# Patient Record
Sex: Female | Born: 1940 | Race: Black or African American | Hispanic: No | Smoking: Former smoker
Health system: Southern US, Community
[De-identification: ages and names within clinical notes are randomized; demographics above are authoritative.]

## PROBLEM LIST (undated history)

## (undated) DIAGNOSIS — I639 Cerebral infarction, unspecified: Secondary | ICD-10-CM

## (undated) DIAGNOSIS — E785 Hyperlipidemia, unspecified: Secondary | ICD-10-CM

## (undated) DIAGNOSIS — I10 Essential (primary) hypertension: Secondary | ICD-10-CM

## (undated) DIAGNOSIS — R413 Other amnesia: Secondary | ICD-10-CM

## (undated) HISTORY — DX: Other amnesia: R41.3

## (undated) HISTORY — DX: Essential (primary) hypertension: I10

## (undated) HISTORY — DX: Hyperlipidemia, unspecified: E78.5

## (undated) HISTORY — DX: Cerebral infarction, unspecified: I63.9

---

## 1977-12-23 HISTORY — PX: TOE SURGERY: SHX1073

## 1999-07-06 ENCOUNTER — Encounter: Admission: RE | Admit: 1999-07-06 | Discharge: 1999-07-06 | Payer: Self-pay | Admitting: *Deleted

## 2000-01-30 ENCOUNTER — Other Ambulatory Visit: Admission: RE | Admit: 2000-01-30 | Discharge: 2000-01-30 | Payer: Self-pay | Admitting: Internal Medicine

## 2000-03-22 ENCOUNTER — Encounter (INDEPENDENT_AMBULATORY_CARE_PROVIDER_SITE_OTHER): Payer: Self-pay | Admitting: *Deleted

## 2000-03-22 ENCOUNTER — Ambulatory Visit (HOSPITAL_COMMUNITY): Admission: RE | Admit: 2000-03-22 | Discharge: 2000-03-22 | Payer: Self-pay | Admitting: *Deleted

## 2000-07-08 ENCOUNTER — Encounter: Admission: RE | Admit: 2000-07-08 | Discharge: 2000-07-08 | Payer: Self-pay | Admitting: Occupational Medicine

## 2000-07-08 ENCOUNTER — Encounter: Payer: Self-pay | Admitting: Occupational Medicine

## 2001-07-10 ENCOUNTER — Encounter: Admission: RE | Admit: 2001-07-10 | Discharge: 2001-07-10 | Payer: Self-pay | Admitting: Occupational Medicine

## 2001-07-10 ENCOUNTER — Encounter: Payer: Self-pay | Admitting: Occupational Medicine

## 2002-01-28 ENCOUNTER — Encounter: Payer: Self-pay | Admitting: Emergency Medicine

## 2002-01-28 ENCOUNTER — Emergency Department (HOSPITAL_COMMUNITY): Admission: EM | Admit: 2002-01-28 | Discharge: 2002-01-28 | Payer: Self-pay | Admitting: Emergency Medicine

## 2002-07-23 ENCOUNTER — Encounter: Payer: Self-pay | Admitting: Internal Medicine

## 2002-07-23 ENCOUNTER — Encounter: Admission: RE | Admit: 2002-07-23 | Discharge: 2002-07-23 | Payer: Self-pay | Admitting: Internal Medicine

## 2003-10-30 ENCOUNTER — Encounter: Admission: RE | Admit: 2003-10-30 | Discharge: 2003-10-30 | Payer: Self-pay | Admitting: Occupational Medicine

## 2004-09-24 HISTORY — PX: OTHER SURGICAL HISTORY: SHX169

## 2005-01-12 ENCOUNTER — Encounter: Admission: RE | Admit: 2005-01-12 | Discharge: 2005-01-12 | Payer: Self-pay | Admitting: Internal Medicine

## 2005-06-08 ENCOUNTER — Ambulatory Visit (HOSPITAL_COMMUNITY): Admission: RE | Admit: 2005-06-08 | Discharge: 2005-06-08 | Payer: Self-pay | Admitting: Family Medicine

## 2005-06-15 ENCOUNTER — Ambulatory Visit: Payer: Self-pay | Admitting: Gastroenterology

## 2005-06-29 ENCOUNTER — Ambulatory Visit: Payer: Self-pay | Admitting: Gastroenterology

## 2006-02-28 ENCOUNTER — Encounter: Admission: RE | Admit: 2006-02-28 | Discharge: 2006-02-28 | Payer: Self-pay | Admitting: Family Medicine

## 2007-03-13 ENCOUNTER — Encounter: Admission: RE | Admit: 2007-03-13 | Discharge: 2007-03-13 | Payer: Self-pay | Admitting: Family Medicine

## 2009-04-27 ENCOUNTER — Emergency Department (HOSPITAL_COMMUNITY): Admission: EM | Admit: 2009-04-27 | Discharge: 2009-04-27 | Payer: Self-pay | Admitting: Emergency Medicine

## 2010-10-04 ENCOUNTER — Other Ambulatory Visit
Admission: RE | Admit: 2010-10-04 | Discharge: 2010-10-04 | Payer: Self-pay | Source: Home / Self Care | Admitting: Family Medicine

## 2010-10-06 ENCOUNTER — Encounter
Admission: RE | Admit: 2010-10-06 | Discharge: 2010-10-06 | Payer: Self-pay | Source: Home / Self Care | Attending: Family Medicine | Admitting: Family Medicine

## 2010-10-16 ENCOUNTER — Encounter
Admission: RE | Admit: 2010-10-16 | Discharge: 2010-10-16 | Payer: Self-pay | Source: Home / Self Care | Attending: Family Medicine | Admitting: Family Medicine

## 2010-10-24 ENCOUNTER — Encounter
Admission: RE | Admit: 2010-10-24 | Discharge: 2010-10-24 | Payer: Self-pay | Source: Home / Self Care | Attending: Family Medicine | Admitting: Family Medicine

## 2010-10-24 ENCOUNTER — Other Ambulatory Visit: Payer: Self-pay | Admitting: Radiology

## 2011-02-09 NOTE — Procedures (Signed)
[place]  Patient:    Yesenia Kramer, Yesenia Kramer                          MRN: [id number] Proc. Date: 03/22/00 Adm. Date:  [id number] Attending:  Sabino Gasser                           Procedure Report  PROCEDURE:  Colonoscopy.  ENDOSCOPIST: Sabino Gasser, M.D.  INDICATIONS: Colon polyp found on flexible sigmoidoscopy.  ANESTHESIA: Demerol 75 mg, Versed 7.5 mg IV in divided dose.  DESCRIPTION OF PROCEDURE: With the patient mildly sedated in the left lateral decubitus position, the Olympus videoscopic colonoscope was inserted in the rectum and passed under direct vision with pressure applied to the abdomen to the cecum. The cecum was identified by ileocecal valve and an appendiceal orifice both of which were photographed. From this point, the colonoscope was slowly withdrawn taking circumferential views of the entire colonic mucosa, stopping only then at 30 cm from the anal verge at which point the previously seen and photographed index polyp was once again noted. Using snare cautery technique on a setting of 20/20 blended current, it was removed. There was good hemostasis. The endoscope was then withdrawn to the rectum after retrieving the polyp in the rectum which appeared normal. The endoscope was placed in retroflexion and viewed the anal canal from above. This appeared normal and was photographed. The endoscope was straightened and withdrawn. The patients vital signs and pulse oximetry remained stable. The patient tolerated the procedure well without apparent complications.  OPERATIVE FINDINGS:  Polyp at 30 cm removed. The patient will call me for the results of the biopsies and followup with me as an outpatient. DD:  03/22/00 TD:  03/23/00 Job: [id number]

## 2011-05-26 DIAGNOSIS — I639 Cerebral infarction, unspecified: Secondary | ICD-10-CM

## 2011-05-26 HISTORY — DX: Cerebral infarction, unspecified: I63.9

## 2011-06-07 ENCOUNTER — Inpatient Hospital Stay (HOSPITAL_COMMUNITY)
Admission: EM | Admit: 2011-06-07 | Discharge: 2011-06-10 | DRG: 093 | Disposition: A | Discharge status: Home / Self Care | Payer: Medicare Other | Source: Ambulatory Visit | Attending: Neurology | Admitting: Neurology

## 2011-06-07 ENCOUNTER — Emergency Department (HOSPITAL_COMMUNITY): Payer: Medicare Other

## 2011-06-07 DIAGNOSIS — E669 Obesity, unspecified: Secondary | ICD-10-CM | POA: Diagnosis present

## 2011-06-07 DIAGNOSIS — Z7982 Long term (current) use of aspirin: Secondary | ICD-10-CM

## 2011-06-07 DIAGNOSIS — I10 Essential (primary) hypertension: Secondary | ICD-10-CM | POA: Diagnosis present

## 2011-06-07 DIAGNOSIS — I6509 Occlusion and stenosis of unspecified vertebral artery: Secondary | ICD-10-CM | POA: Diagnosis present

## 2011-06-07 DIAGNOSIS — R269 Unspecified abnormalities of gait and mobility: Principal | ICD-10-CM | POA: Diagnosis present

## 2011-06-07 DIAGNOSIS — R42 Dizziness and giddiness: Secondary | ICD-10-CM | POA: Diagnosis present

## 2011-06-07 LAB — TROPONIN I: Troponin I: <0.3 ng/mL (ref <0.30)

## 2011-06-07 LAB — CBC
HCT: 38.2 % (ref 36.0–46.0)
Hemoglobin: 12.4 g/dL (ref 12.0–15.0)
MCV: 82.3 fL (ref 78.0–100.0)
Platelets: 226 10*3/uL (ref 150–400)
WBC: 4.5 10*3/uL (ref 4.0–10.5)

## 2011-06-07 LAB — COMPREHENSIVE METABOLIC PANEL
ALT: 23 U/L (ref 0–35)
CO2: 30 mEq/L (ref 19–32)
Chloride: 102 mEq/L (ref 96–112)
Glucose, Bld: 121 mg/dL — ABNORMAL HIGH (ref 70–99)
Potassium: 4 mEq/L (ref 3.5–5.1)
Total Bilirubin: 0.2 mg/dL — ABNORMAL LOW (ref 0.3–1.2)

## 2011-06-07 LAB — DIFFERENTIAL
Basophils Absolute: 0 10*3/uL (ref 0.0–0.1)
Basophils Relative: 0 % (ref 0–1)
Eosinophils Relative: 2 % (ref 0–5)
Monocytes Absolute: 0.3 10*3/uL (ref 0.1–1.0)

## 2011-06-07 LAB — CK TOTAL AND CKMB (NOT AT ARMC): Total CK: 235 U/L — ABNORMAL HIGH (ref 7–177)

## 2011-06-07 LAB — URINALYSIS, ROUTINE W REFLEX MICROSCOPIC
Ketones, ur: NEGATIVE mg/dL
Leukocytes, UA: NEGATIVE
Nitrite: NEGATIVE
Protein, ur: NEGATIVE mg/dL
Specific Gravity, Urine: 1.025 (ref 1.005–1.030)
Urobilinogen, UA: 0.2 mg/dL (ref 0.0–1.0)

## 2011-06-07 LAB — POCT I-STAT TROPONIN I
Troponin i, poc: 0 ng/mL (ref 0.00–0.08)
Troponin i, poc: 0 ng/mL (ref 0.00–0.08)

## 2011-06-07 LAB — PROTIME-INR
INR: 0.89 (ref 0.00–1.49)
Prothrombin Time: 12.2 s (ref 11.6–15.2)

## 2011-06-07 MED ORDER — GADOBENATE DIMEGLUMINE 529 MG/ML IV SOLN: 15.0000 mL | Freq: Once | INTRAVENOUS | Status: AC | PRN

## 2011-06-08 DIAGNOSIS — I059 Rheumatic mitral valve disease, unspecified: Secondary | ICD-10-CM

## 2011-06-08 LAB — CK TOTAL AND CKMB (NOT AT ARMC)
CK, MB: 2.8 ng/mL (ref 0.3–4.0)
Relative Index: 1.4 (ref 0.0–2.5)
Total CK: 199 U/L — ABNORMAL HIGH (ref 7–177)

## 2011-06-08 LAB — HEMOGLOBIN A1C
Hgb A1c MFr Bld: 6.6 % — ABNORMAL HIGH (ref <5.7)
Mean Plasma Glucose: 143 mg/dL — ABNORMAL HIGH (ref <117)

## 2011-06-08 LAB — LIPID PANEL
LDL Cholesterol: 139 mg/dL — ABNORMAL HIGH (ref 0–99)
Triglycerides: 124 mg/dL (ref <150)
VLDL: 25 mg/dL (ref 0–40)

## 2011-06-08 LAB — GLUCOSE, CAPILLARY
Glucose-Capillary: 115 mg/dL — ABNORMAL HIGH (ref 70–99)
Glucose-Capillary: 87 mg/dL (ref 70–99)

## 2011-06-09 LAB — GLUCOSE, CAPILLARY: Glucose-Capillary: 101 mg/dL — ABNORMAL HIGH (ref 70–99)

## 2011-06-11 LAB — GLUCOSE, CAPILLARY
Glucose-Capillary: 109 mg/dL — ABNORMAL HIGH (ref 70–99)
Glucose-Capillary: 108 mg/dL — ABNORMAL HIGH (ref 70–99)
Glucose-Capillary: 107 mg/dL — ABNORMAL HIGH (ref 70–99)

## 2011-06-18 ENCOUNTER — Ambulatory Visit: Payer: Federal, State, Local not specified - PPO | Admitting: *Deleted

## 2011-06-25 ENCOUNTER — Ambulatory Visit: Payer: Federal, State, Local not specified - PPO | Admitting: Physical Therapy

## 2011-06-27 ENCOUNTER — Ambulatory Visit: Payer: Medicare Other | Attending: Neurology | Admitting: Physical Therapy

## 2011-06-27 DIAGNOSIS — IMO0001 Reserved for inherently not codable concepts without codable children: Secondary | ICD-10-CM | POA: Insufficient documentation

## 2011-06-27 DIAGNOSIS — R5381 Other malaise: Secondary | ICD-10-CM | POA: Insufficient documentation

## 2011-06-29 NOTE — H&P (Signed)
NAME:  Yesenia Kramer, Yesenia Kramer NO.:  [id number]  MEDICAL RECORD NO.:  [id number]  LOCATION:  [facility]                         FACILITY:  [facility]  PHYSICIAN:  Levert Feinstein, MD          DATE OF BIRTH:  1941/03/16  DATE OF ADMISSION:  06/07/2011 DATE OF DISCHARGE:                             HISTORY & PHYSICAL   CHIEF COMPLAINT:  Acute onset of dizziness, unbalanced gait.  HISTORY OF PRESENT ILLNESS:  The patient is a pleasant 70 year old left- handed African American female, went to bed at baseline on June 06, 2011, at 11 p.m., woke up the next morning 6 a.m., noticed unbalanced gait, difficulty focusing, nauseous, her symptom has been persistent since its onset.  She was brought by ambulance to emergency, noticed to have mild elevated blood pressure at 170/98.  She denies tinnitus, hearing change, MRI of the brain has demonstrated supratentorial and small vessel disease.  No acute lesions. MRA of the brain was normal.  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  LABORATORY EVALUATION:  Troponin was negative.  Normal CMP with the exception of mild elevated glucose at 121.  Normal CBC, UA.  Chest x-ray was negative.  REVIEW OF SYSTEMS:  Pertinent as above.  PAST MEDICAL HISTORY:  Hypertension for 10 years, taking Dyazide, not on aspirin treatment.  PAST SURGICAL HISTORY:  None.  ALLERGIES:  No known drug allergies.  FAMILY HISTORY:  Father died at age 68 with reason unknown.  Mother died of ovarian cancer at age 57.  SOCIAL HISTORY:  She lives alone, retired from [employer]  Has 2 adult children.  No smoking, no drinking, no illicit drugs.  PHYSICAL EXAMINATION:  VITAL SIGNS:  Temperature 98.3, blood pressure 172/98, heart rate of 56, respiration of 16, saturation was 97%. CARDIAC:  Regular rate and rhythm. NEUROLOGICAL:  She is awake, alert, oriented to history taking, care of conversation.  No  aphasia.  No dysarthria.  Cranial nerves II through XII.  Pupil equal, round, reactive to light.  She has horizontal nystagmus face to right, increased in nystagmus on right gaze, decreased to left gaze.  Facial sensation strength was normal.  Uvula and tongue midline.  Head turning, shoulder shrugging normal and symmetric.  Motor examination, moves all four extremities without difficulty.  No drift. Deep tendon reflex present, symmetric.  Plantar responses were flexor. Coordination normal finger-to-nose, heel-to-shin, but she has trunk ataxia tends severe to the right side ataxic gait.  ASSESSMENT/PLAN:  A 70 year old African American female with vascular risk factor of obesity, hypertension, not on antiplatelet agent presenting with acute onset of ataxic gait, dizziness, there was nystagmus and trunk ataxia on physical examination, most consistent with small vessel acute infarction involving white brain stem or cerebellum even though it does not show up on the initial MRI scan. 1. Complete evaluation with echocardiogram. 2. Laboratory evaluation. 3. Start aspirin 325 mg every day. 4. PT/OT.     Levert Feinstein, MD     YY/MEDQ  D:  06/07/2011  T:  06/08/2011  Job:  [id number]  Electronically Signed by Levert Feinstein MD  on 06/29/2011 10:08:36 AM

## 2011-07-13 NOTE — Discharge Summary (Signed)
  NAME:  Yesenia Kramer, Yesenia Kramer NO.:  [id number]  MEDICAL RECORD NO.:  [id number]  LOCATION:  3008                         FACILITY:  [facility]  PHYSICIAN:  Joycelyn Schmid, MD   DATE OF BIRTH:  [birth year]  DATE OF ADMISSION:  06/07/2011 DATE OF DISCHARGE:  06/10/2011                              DISCHARGE SUMMARY   DISCHARGE DIAGNOSIS:  MRI negative stroke.  PRINCIPAL PROCEDURE: 1. MRI of the brain, which shows mild atrophy and chronic small-vessel     ischemic disease with no acute findings. 2. MRA of the head shows no intracranial stenosis or aneurysm. 3. 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  PERTINENT LABORATORY DATA:  Lipid panel; LDL 139, cholesterol 214, triglycerides 124, and HDL 50.  Hemoglobin A1c 6.6.  Transthoracic echocardiogram shows ejection fraction 60% to 65%, mild mitral regurgitation.  No cardiac source of emboli.  HOSPITAL COURSE:  The patient presented with gait difficulty, nausea, and truncal ataxia on June 07, 2011, at 6 o'clock a.m. when she woke up.  Last seen normal was 11 o'clock the prior night.  The patient came in hypertensive, 170/98.  The patient progressively improved in her symptoms.  She had a stroke workup.  On day of discharge, the patient was able to have full strength in her upper and lower extremities.  No vertigo or dizziness.  She was able to walk 200 feet with rolling walker with physical therapy.  No occupational therapy needs were recommended.  DISCHARGE INSTRUCTIONS: 1. Follow up with primary care in 4-6 weeks, Dr. Jillyn Hidden. 2. Follow up with Dr. Pearlean Brownie, Stroke [facility] in 4 weeks, phone number     [phone]. 3. No driving until follow up with Stroke Clinic. 4. The patient is unable to participate in jury duty because of     driving restrictions, limited mobility, and recent stroke. 5. Outpatient physical therapy.     Joycelyn Schmid, MD     VP/MEDQ  D:  06/10/2011  T:   06/10/2011  Job:  [id number]  Electronically Signed by Joycelyn Schmid  on 07/13/2011 05:44:48 PM

## 2011-07-27 IMAGING — US US CORE
1 series · 10 of 10 positions shown · non-contrast
Comparison: none

***ADDENDUM*** CREATED: 10/27/2010 [DATE]

Pathology revealed benign breast tissue with cysts and focal
stromal fibrosis in the right breast. This was found to be
concordant by Dr. Haji Aimal Nargis. Pathology was relayed by
telephone. The patient reported doing well after the biopsy. Post
biopsy instructions were reviewed and her questions were answered.
She was encouraged to call The [REDACTED]
for any additional concerns. She was asked to return in 1 year for
screening mammography.
Pathology results are dictated by Gayeng Artika RN, BSN on [place]
Addended by:  Ansovar Rramsay, M.D. on 10/27/2010 [DATE].
***END ADDENDUM*** SIGNED BY: Ansovar Rramsay, M.D.
CLINICAL DATA: Right breast mass

[Series 1: us core · 10 of 10 slices shown]
[im 1/10]
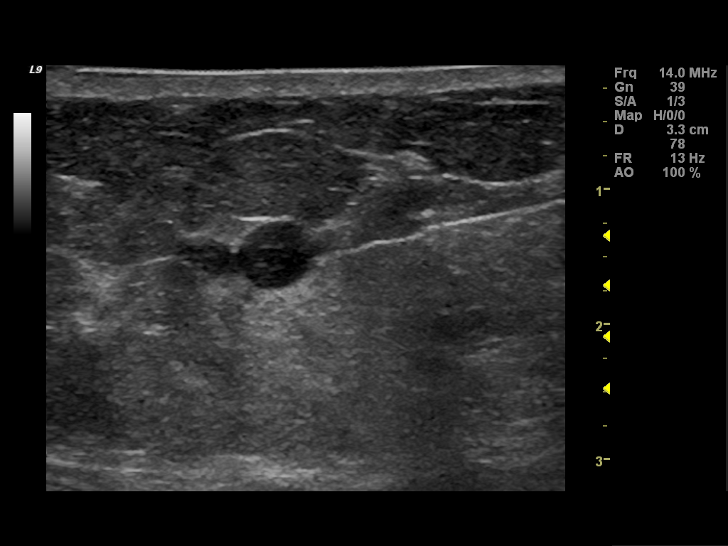
[im 2/10]
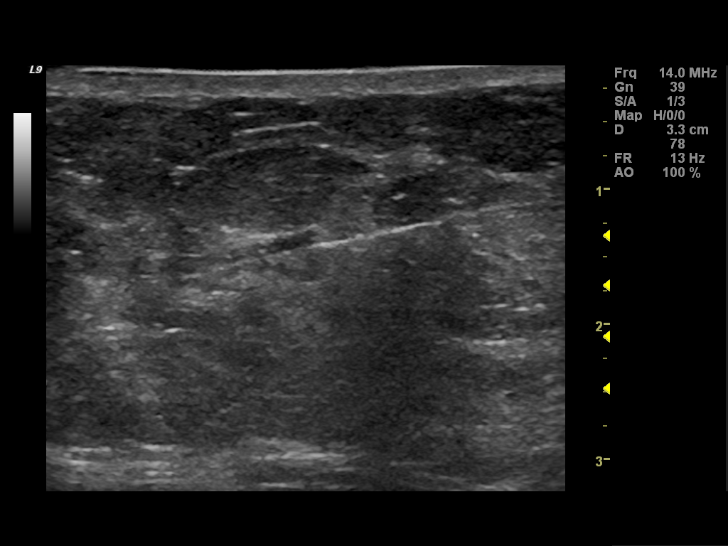
[im 3/10]
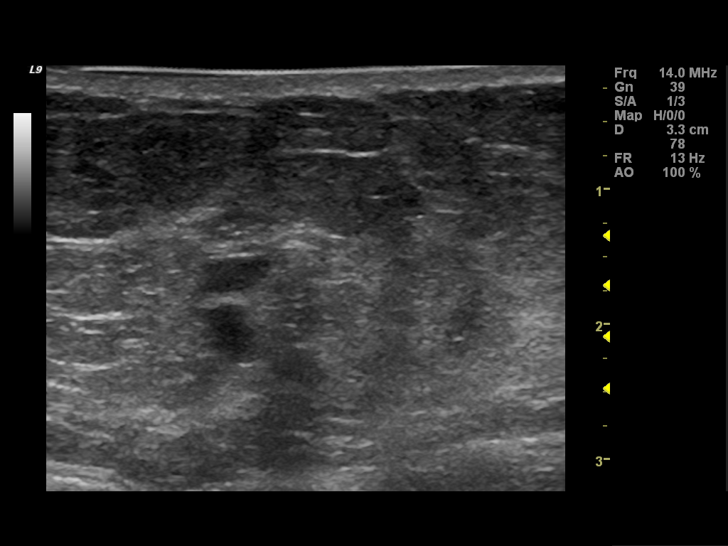
[im 4/10]
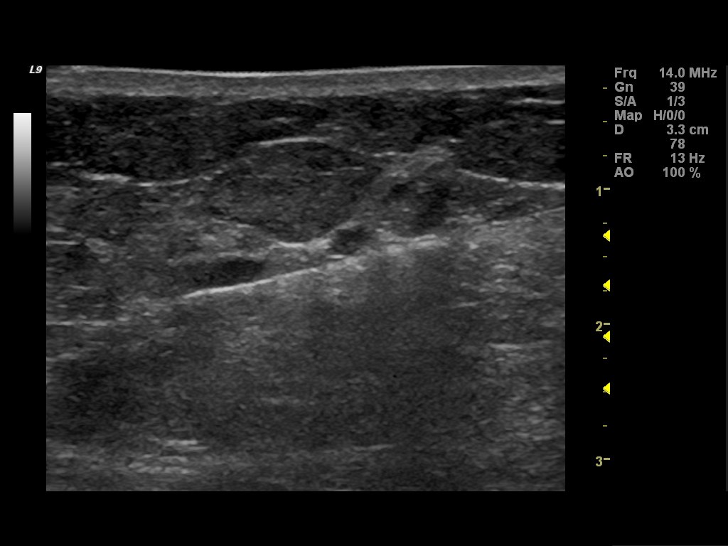
[im 5/10]
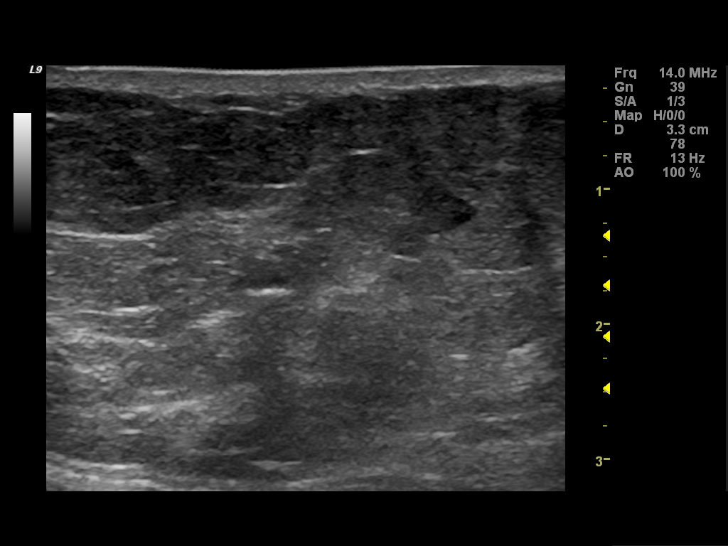
[im 6/10]
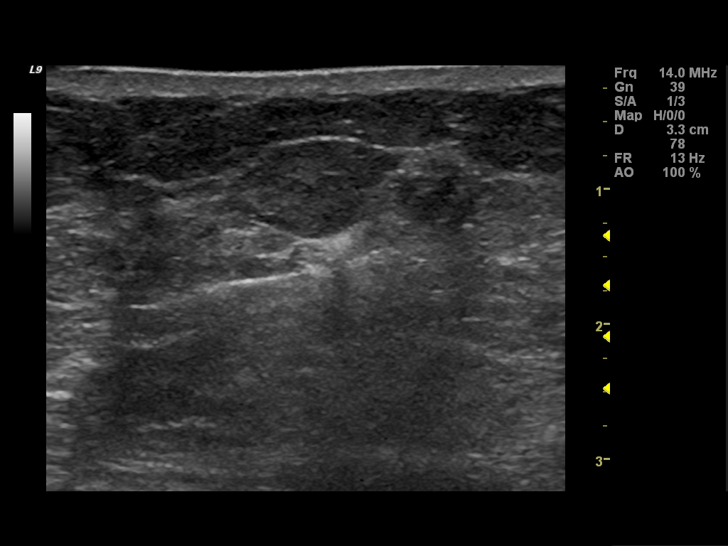
[im 7/10]
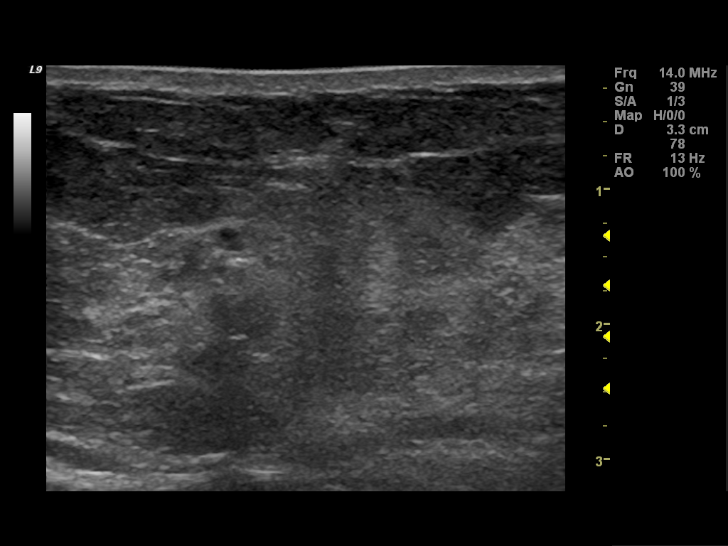
[im 8/10]
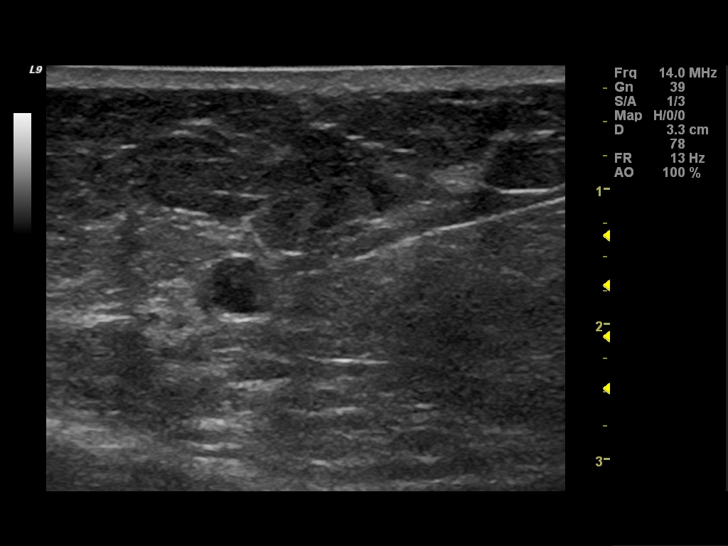
[im 9/10]
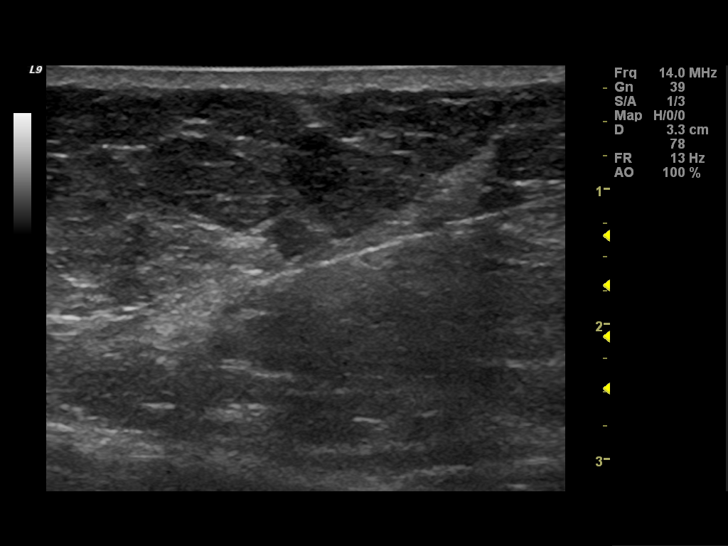
[im 10/10]
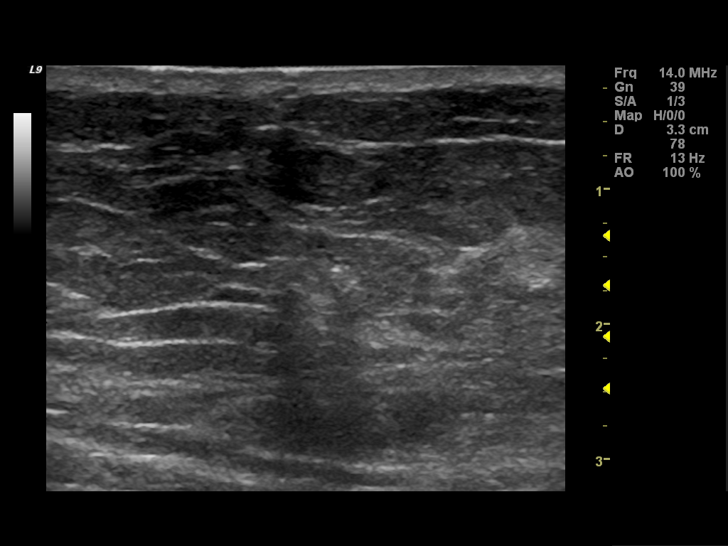

[10 of 10 positions shown; findings below may reference images not displayed]

ULTRASOUND GUIDED CORE BIOPSY OF THE RIGHT BREAST

I met with the patient, and we discussed the procedure of
ultrasound-guided biopsy, including risks, benefits, and
alternatives.  Specifically, we discussed the risks of infection,
bleeding, tissue injury, clip migration, and inadequate sampling.
Informed, written consent was given.

Using sterile technique,  2% lidocaine, ultrasound guidance, and a
14 gauge automated biopsy device, biopsy was performed of the mass
located within the right breast at the 10 o'clock position.  At the
conclusion of the procedure, a [employer]dromark  tissue marker clip was
deployed into the biopsy cavity.  Follow-up 2-view mammogram was
performed and dictated separately.
IMPRESSION: Ultrasound-guided biopsy of the right breast mass located at the 10
o'clock position as discussed above.  No apparent complications.

## 2011-09-10 ENCOUNTER — Other Ambulatory Visit: Payer: Self-pay | Admitting: Family Medicine

## 2011-09-10 DIAGNOSIS — Z1231 Encounter for screening mammogram for malignant neoplasm of breast: Secondary | ICD-10-CM

## 2011-10-10 ENCOUNTER — Other Ambulatory Visit: Payer: Self-pay | Admitting: Family Medicine

## 2011-10-10 ENCOUNTER — Ambulatory Visit
Admission: RE | Admit: 2011-10-10 | Discharge: 2011-10-10 | Disposition: A | Discharge status: Home / Self Care | Payer: Federal, State, Local not specified - PPO | Source: Ambulatory Visit | Attending: Family Medicine | Admitting: Family Medicine

## 2011-10-10 DIAGNOSIS — N63 Unspecified lump in unspecified breast: Secondary | ICD-10-CM

## 2011-10-10 DIAGNOSIS — Z1231 Encounter for screening mammogram for malignant neoplasm of breast: Secondary | ICD-10-CM

## 2011-10-12 ENCOUNTER — Ambulatory Visit: Payer: Medicare Other

## 2011-10-19 DIAGNOSIS — I6350 Cerebral infarction due to unspecified occlusion or stenosis of unspecified cerebral artery: Secondary | ICD-10-CM | POA: Diagnosis not present

## 2011-10-19 DIAGNOSIS — Z79899 Other long term (current) drug therapy: Secondary | ICD-10-CM | POA: Diagnosis not present

## 2011-10-19 DIAGNOSIS — L301 Dyshidrosis [pompholyx]: Secondary | ICD-10-CM | POA: Diagnosis not present

## 2011-10-24 ENCOUNTER — Other Ambulatory Visit: Payer: Self-pay | Admitting: Family Medicine

## 2011-10-24 ENCOUNTER — Ambulatory Visit
Admission: RE | Admit: 2011-10-24 | Discharge: 2011-10-24 | Disposition: A | Discharge status: Home / Self Care | Payer: Medicare Other | Source: Ambulatory Visit | Attending: Family Medicine | Admitting: Family Medicine

## 2011-10-24 ENCOUNTER — Ambulatory Visit
Admission: RE | Admit: 2011-10-24 | Discharge: 2011-10-24 | Disposition: A | Discharge status: Home / Self Care | Payer: Federal, State, Local not specified - PPO | Source: Ambulatory Visit | Attending: Family Medicine | Admitting: Family Medicine

## 2011-10-24 DIAGNOSIS — N6489 Other specified disorders of breast: Secondary | ICD-10-CM | POA: Diagnosis not present

## 2011-10-24 DIAGNOSIS — N63 Unspecified lump in unspecified breast: Secondary | ICD-10-CM

## 2012-02-13 DIAGNOSIS — L301 Dyshidrosis [pompholyx]: Secondary | ICD-10-CM | POA: Diagnosis not present

## 2012-03-12 DIAGNOSIS — L301 Dyshidrosis [pompholyx]: Secondary | ICD-10-CM | POA: Diagnosis not present

## 2012-07-08 DIAGNOSIS — Z79899 Other long term (current) drug therapy: Secondary | ICD-10-CM | POA: Diagnosis not present

## 2012-07-08 DIAGNOSIS — Z23 Encounter for immunization: Secondary | ICD-10-CM | POA: Diagnosis not present

## 2012-07-08 DIAGNOSIS — I6350 Cerebral infarction due to unspecified occlusion or stenosis of unspecified cerebral artery: Secondary | ICD-10-CM | POA: Diagnosis not present

## 2012-07-11 ENCOUNTER — Encounter: Payer: Self-pay | Admitting: Gastroenterology

## 2012-08-29 ENCOUNTER — Ambulatory Visit (AMBULATORY_SURGERY_CENTER): Payer: Federal, State, Local not specified - PPO | Admitting: *Deleted

## 2012-08-29 VITALS — Ht 66.0 in | Wt 230.0 lb

## 2012-08-29 DIAGNOSIS — Z1211 Encounter for screening for malignant neoplasm of colon: Secondary | ICD-10-CM

## 2012-08-29 MED ORDER — SUPREP BOWEL PREP KIT 17.5-3.13-1.6 GM/177ML PO SOLN: ORAL | Status: DC

## 2012-09-11 ENCOUNTER — Encounter: Payer: Self-pay | Admitting: Gastroenterology

## 2012-09-11 ENCOUNTER — Ambulatory Visit (AMBULATORY_SURGERY_CENTER): Payer: Federal, State, Local not specified - PPO | Admitting: Gastroenterology

## 2012-09-11 VITALS — BP 128/74 | HR 59 | Temp 97.8°F | Resp 10 | Ht 66.0 in | Wt 230.0 lb

## 2012-09-11 DIAGNOSIS — Z1211 Encounter for screening for malignant neoplasm of colon: Secondary | ICD-10-CM | POA: Diagnosis not present

## 2012-09-11 DIAGNOSIS — Z8673 Personal history of transient ischemic attack (TIA), and cerebral infarction without residual deficits: Secondary | ICD-10-CM | POA: Diagnosis not present

## 2012-09-11 DIAGNOSIS — K5730 Diverticulosis of large intestine without perforation or abscess without bleeding: Secondary | ICD-10-CM

## 2012-09-11 DIAGNOSIS — E669 Obesity, unspecified: Secondary | ICD-10-CM | POA: Diagnosis not present

## 2012-09-11 MED ORDER — SODIUM CHLORIDE 0.9 % IV SOLN: 500.0000 mL | INTRAVENOUS | Status: DC

## 2012-09-11 NOTE — Progress Notes (Signed)
Patient did not have preoperative order for IV antibiotic SSI prophylaxis. (G8918)  Patient did not experience any of the following events: a burn prior to discharge; a fall within the facility; wrong site/side/patient/procedure/implant event; or a hospital transfer or hospital admission upon discharge from the facility. (G8907)  

## 2012-09-11 NOTE — Progress Notes (Signed)
Propofol given over incremental dosages 

## 2012-09-11 NOTE — Op Note (Signed)
[facility] [address] [place], [address]   COLONOSCOPY PROCEDURE REPORT  PATIENT: Yesenia Kramer, Yesenia Kramer  MR#: [id number] BIRTHDATE: 04/30/1941 , 71  yrs. old GENDER: Female ENDOSCOPIST: Louis Meckel, MD REFERRED UV:OZDGUY Fulp PROCEDURE DATE:  09/11/2012 PROCEDURE:   Colonoscopy, diagnostic ASA CLASS:   Class II INDICATIONS:average risk screening. MEDICATIONS: MAC sedation, administered by CRNA and propofol (Diprivan) 150mg  IV  DESCRIPTION OF PROCEDURE:   After the risks benefits and alternatives of the procedure were thoroughly explained, informed consent was obtained.  A digital rectal exam revealed no abnormalities of the rectum.   The LB [id number]-H180AL [id number]  endoscope was introduced through the anus and advanced to the cecum, which was identified by both the appendix and ileocecal valve. No adverse events experienced.   The quality of the prep was Suprep excellent The instrument was then slowly withdrawn as the colon was fully examined.      COLON FINDINGS: Mild diverticulosis was noted in the sigmoid colon. The colon mucosa was otherwise normal.  Retroflexed views revealed no abnormalities. The time to cecum=5 minutes 26 seconds. Withdrawal time=7 minutes 47 seconds.  The scope was withdrawn and the procedure completed. COMPLICATIONS: There were no complications.  ENDOSCOPIC IMPRESSION: 1.   Mild diverticulosis was noted in the sigmoid colon 2.   The colon mucosa was otherwise normal  RECOMMENDATIONS: Continue current colorectal screening recommendations for "routine risk" patients with a repeat colonoscopy in 10 years.   eSigned:  Louis Meckel, MD 09/11/2012 10:52 AM   cc:

## 2012-09-11 NOTE — Patient Instructions (Addendum)

## 2012-09-12 ENCOUNTER — Telehealth: Payer: Self-pay | Admitting: *Deleted

## 2012-09-12 NOTE — Telephone Encounter (Signed)
  Follow up Call-  Call back number 09/11/2012  Post procedure Call Back phone  # [phone]  Permission to leave phone message Yes     Patient questions:  Do you have a fever, pain , or abdominal swelling? no Pain Score  0 *  Have you tolerated food without any problems? yes  Have you been able to return to your normal activities? yes  Do you have any questions about your discharge instructions: Diet   no Medications  no Follow up visit  no  Do you have questions or concerns about your Care? no  Actions: * If pain score is 4 or above: No action needed, pain <4.

## 2012-12-02 DIAGNOSIS — H52229 Regular astigmatism, unspecified eye: Secondary | ICD-10-CM | POA: Diagnosis not present

## 2012-12-02 DIAGNOSIS — H2510 Age-related nuclear cataract, unspecified eye: Secondary | ICD-10-CM | POA: Diagnosis not present

## 2012-12-02 DIAGNOSIS — H538 Other visual disturbances: Secondary | ICD-10-CM | POA: Diagnosis not present

## 2012-12-02 DIAGNOSIS — H43819 Vitreous degeneration, unspecified eye: Secondary | ICD-10-CM | POA: Diagnosis not present

## 2012-12-02 DIAGNOSIS — H524 Presbyopia: Secondary | ICD-10-CM | POA: Diagnosis not present

## 2012-12-15 ENCOUNTER — Other Ambulatory Visit: Payer: Self-pay | Admitting: Family Medicine

## 2012-12-15 DIAGNOSIS — N63 Unspecified lump in unspecified breast: Secondary | ICD-10-CM

## 2012-12-22 ENCOUNTER — Ambulatory Visit
Admission: RE | Admit: 2012-12-22 | Discharge: 2012-12-22 | Disposition: A | Discharge status: Home / Self Care | Payer: Medicare Other | Source: Ambulatory Visit | Attending: Family Medicine | Admitting: Family Medicine

## 2012-12-22 DIAGNOSIS — N63 Unspecified lump in unspecified breast: Secondary | ICD-10-CM

## 2012-12-22 DIAGNOSIS — R928 Other abnormal and inconclusive findings on diagnostic imaging of breast: Secondary | ICD-10-CM | POA: Diagnosis not present

## 2013-10-06 ENCOUNTER — Ambulatory Visit (INDEPENDENT_AMBULATORY_CARE_PROVIDER_SITE_OTHER): Payer: Self-pay | Admitting: *Deleted

## 2013-10-06 DIAGNOSIS — I6350 Cerebral infarction due to unspecified occlusion or stenosis of unspecified cerebral artery: Secondary | ICD-10-CM

## 2013-10-06 DIAGNOSIS — I639 Cerebral infarction, unspecified: Secondary | ICD-10-CM

## 2013-10-06 NOTE — Progress Notes (Signed)
Participant in the office today for her IRIS 2nd. Annual visit. MMSE was completed successfully.  Blood was drawn and Shipped.  Participant to return old bottle of study medication to the coordinating center.  Participant to continue with follow- up visits per IRIS protocol.

## 2013-11-11 DIAGNOSIS — E785 Hyperlipidemia, unspecified: Secondary | ICD-10-CM | POA: Diagnosis not present

## 2013-11-11 DIAGNOSIS — I10 Essential (primary) hypertension: Secondary | ICD-10-CM | POA: Diagnosis not present

## 2013-11-11 DIAGNOSIS — Z8673 Personal history of transient ischemic attack (TIA), and cerebral infarction without residual deficits: Secondary | ICD-10-CM | POA: Diagnosis not present

## 2013-11-11 DIAGNOSIS — B029 Zoster without complications: Secondary | ICD-10-CM | POA: Diagnosis not present

## 2013-12-08 DIAGNOSIS — H52229 Regular astigmatism, unspecified eye: Secondary | ICD-10-CM | POA: Diagnosis not present

## 2013-12-08 DIAGNOSIS — H2510 Age-related nuclear cataract, unspecified eye: Secondary | ICD-10-CM | POA: Diagnosis not present

## 2013-12-08 DIAGNOSIS — H538 Other visual disturbances: Secondary | ICD-10-CM | POA: Diagnosis not present

## 2013-12-08 DIAGNOSIS — H524 Presbyopia: Secondary | ICD-10-CM | POA: Diagnosis not present

## 2014-03-31 DIAGNOSIS — Z79899 Other long term (current) drug therapy: Secondary | ICD-10-CM | POA: Diagnosis not present

## 2014-03-31 DIAGNOSIS — I10 Essential (primary) hypertension: Secondary | ICD-10-CM | POA: Diagnosis not present

## 2014-05-04 DIAGNOSIS — Z23 Encounter for immunization: Secondary | ICD-10-CM | POA: Diagnosis not present

## 2014-05-04 DIAGNOSIS — E785 Hyperlipidemia, unspecified: Secondary | ICD-10-CM | POA: Diagnosis not present

## 2014-05-04 DIAGNOSIS — M79609 Pain in unspecified limb: Secondary | ICD-10-CM | POA: Diagnosis not present

## 2014-05-04 DIAGNOSIS — Z79899 Other long term (current) drug therapy: Secondary | ICD-10-CM | POA: Diagnosis not present

## 2014-05-04 DIAGNOSIS — Z1211 Encounter for screening for malignant neoplasm of colon: Secondary | ICD-10-CM | POA: Diagnosis not present

## 2014-05-04 DIAGNOSIS — R7301 Impaired fasting glucose: Secondary | ICD-10-CM | POA: Diagnosis not present

## 2014-05-04 DIAGNOSIS — I679 Cerebrovascular disease, unspecified: Secondary | ICD-10-CM | POA: Diagnosis not present

## 2014-05-04 DIAGNOSIS — I10 Essential (primary) hypertension: Secondary | ICD-10-CM | POA: Diagnosis not present

## 2014-05-05 ENCOUNTER — Other Ambulatory Visit: Payer: Self-pay | Admitting: Family Medicine

## 2014-05-05 ENCOUNTER — Other Ambulatory Visit: Payer: Self-pay

## 2014-05-05 DIAGNOSIS — Z1231 Encounter for screening mammogram for malignant neoplasm of breast: Secondary | ICD-10-CM

## 2014-05-05 DIAGNOSIS — Z8673 Personal history of transient ischemic attack (TIA), and cerebral infarction without residual deficits: Secondary | ICD-10-CM

## 2014-05-05 DIAGNOSIS — E2839 Other primary ovarian failure: Secondary | ICD-10-CM

## 2014-05-14 ENCOUNTER — Ambulatory Visit
Admission: RE | Admit: 2014-05-14 | Discharge: 2014-05-14 | Disposition: A | Discharge status: Home / Self Care | Payer: Medicare Other | Source: Ambulatory Visit | Attending: Family Medicine | Admitting: Family Medicine

## 2014-05-14 ENCOUNTER — Encounter (INDEPENDENT_AMBULATORY_CARE_PROVIDER_SITE_OTHER): Payer: Self-pay

## 2014-05-14 DIAGNOSIS — I658 Occlusion and stenosis of other precerebral arteries: Secondary | ICD-10-CM | POA: Diagnosis not present

## 2014-05-14 DIAGNOSIS — Z8673 Personal history of transient ischemic attack (TIA), and cerebral infarction without residual deficits: Secondary | ICD-10-CM

## 2014-05-18 DIAGNOSIS — E785 Hyperlipidemia, unspecified: Secondary | ICD-10-CM | POA: Diagnosis not present

## 2014-05-18 DIAGNOSIS — I679 Cerebrovascular disease, unspecified: Secondary | ICD-10-CM | POA: Diagnosis not present

## 2014-05-18 DIAGNOSIS — R7301 Impaired fasting glucose: Secondary | ICD-10-CM | POA: Diagnosis not present

## 2014-05-18 DIAGNOSIS — Z7901 Long term (current) use of anticoagulants: Secondary | ICD-10-CM | POA: Diagnosis not present

## 2014-05-19 ENCOUNTER — Ambulatory Visit
Admission: RE | Admit: 2014-05-19 | Discharge: 2014-05-19 | Disposition: A | Discharge status: Home / Self Care | Payer: Medicare Other | Source: Ambulatory Visit

## 2014-05-19 ENCOUNTER — Ambulatory Visit
Admission: RE | Admit: 2014-05-19 | Discharge: 2014-05-19 | Disposition: A | Discharge status: Home / Self Care | Payer: Medicare Other | Source: Ambulatory Visit | Attending: Family Medicine | Admitting: Family Medicine

## 2014-05-19 DIAGNOSIS — E2839 Other primary ovarian failure: Secondary | ICD-10-CM

## 2014-05-19 DIAGNOSIS — Z1231 Encounter for screening mammogram for malignant neoplasm of breast: Secondary | ICD-10-CM | POA: Diagnosis not present

## 2014-05-19 DIAGNOSIS — Z780 Asymptomatic menopausal state: Secondary | ICD-10-CM | POA: Diagnosis not present

## 2014-05-28 ENCOUNTER — Other Ambulatory Visit: Payer: Self-pay | Admitting: Family Medicine

## 2014-05-28 DIAGNOSIS — E049 Nontoxic goiter, unspecified: Secondary | ICD-10-CM

## 2014-06-02 ENCOUNTER — Ambulatory Visit
Admission: RE | Admit: 2014-06-02 | Discharge: 2014-06-02 | Disposition: A | Discharge status: Home / Self Care | Payer: Medicare Other | Source: Ambulatory Visit | Attending: Family Medicine | Admitting: Family Medicine

## 2014-06-02 DIAGNOSIS — E042 Nontoxic multinodular goiter: Secondary | ICD-10-CM | POA: Diagnosis not present

## 2014-06-02 DIAGNOSIS — E049 Nontoxic goiter, unspecified: Secondary | ICD-10-CM

## 2014-11-30 DIAGNOSIS — I10 Essential (primary) hypertension: Secondary | ICD-10-CM | POA: Diagnosis not present

## 2014-11-30 DIAGNOSIS — R7309 Other abnormal glucose: Secondary | ICD-10-CM | POA: Diagnosis not present

## 2014-11-30 DIAGNOSIS — I6529 Occlusion and stenosis of unspecified carotid artery: Secondary | ICD-10-CM | POA: Diagnosis not present

## 2014-11-30 DIAGNOSIS — M79606 Pain in leg, unspecified: Secondary | ICD-10-CM | POA: Diagnosis not present

## 2014-11-30 DIAGNOSIS — I6931 Cognitive deficits following cerebral infarction: Secondary | ICD-10-CM | POA: Diagnosis not present

## 2014-11-30 DIAGNOSIS — E785 Hyperlipidemia, unspecified: Secondary | ICD-10-CM | POA: Diagnosis not present

## 2014-11-30 DIAGNOSIS — E041 Nontoxic single thyroid nodule: Secondary | ICD-10-CM | POA: Diagnosis not present

## 2014-12-08 ENCOUNTER — Other Ambulatory Visit: Payer: Self-pay | Admitting: *Deleted

## 2014-12-08 DIAGNOSIS — M7989 Other specified soft tissue disorders: Secondary | ICD-10-CM

## 2014-12-08 DIAGNOSIS — M79606 Pain in leg, unspecified: Secondary | ICD-10-CM

## 2014-12-14 DIAGNOSIS — H538 Other visual disturbances: Secondary | ICD-10-CM | POA: Diagnosis not present

## 2014-12-14 DIAGNOSIS — H43812 Vitreous degeneration, left eye: Secondary | ICD-10-CM | POA: Diagnosis not present

## 2014-12-14 DIAGNOSIS — H52229 Regular astigmatism, unspecified eye: Secondary | ICD-10-CM | POA: Diagnosis not present

## 2014-12-14 DIAGNOSIS — H524 Presbyopia: Secondary | ICD-10-CM | POA: Diagnosis not present

## 2014-12-14 DIAGNOSIS — H2513 Age-related nuclear cataract, bilateral: Secondary | ICD-10-CM | POA: Diagnosis not present

## 2015-01-06 ENCOUNTER — Encounter (HOSPITAL_COMMUNITY): Payer: Federal, State, Local not specified - PPO

## 2015-01-06 ENCOUNTER — Encounter: Payer: Federal, State, Local not specified - PPO | Admitting: Vascular Surgery

## 2015-01-19 ENCOUNTER — Encounter: Payer: Self-pay | Admitting: Vascular Surgery

## 2015-01-20 ENCOUNTER — Encounter: Payer: Self-pay | Admitting: Vascular Surgery

## 2015-01-20 ENCOUNTER — Ambulatory Visit (INDEPENDENT_AMBULATORY_CARE_PROVIDER_SITE_OTHER): Payer: Medicare Other | Admitting: Vascular Surgery

## 2015-01-20 ENCOUNTER — Ambulatory Visit (HOSPITAL_COMMUNITY)
Admission: RE | Admit: 2015-01-20 | Discharge: 2015-01-20 | Disposition: A | Discharge status: Home / Self Care | Payer: Medicare Other | Source: Ambulatory Visit | Attending: Vascular Surgery | Admitting: Vascular Surgery

## 2015-01-20 VITALS — BP 155/76 | HR 70 | Ht 66.0 in | Wt 232.0 lb

## 2015-01-20 DIAGNOSIS — M7989 Other specified soft tissue disorders: Secondary | ICD-10-CM | POA: Diagnosis not present

## 2015-01-20 DIAGNOSIS — M79662 Pain in left lower leg: Secondary | ICD-10-CM | POA: Insufficient documentation

## 2015-01-20 DIAGNOSIS — M79605 Pain in left leg: Secondary | ICD-10-CM | POA: Diagnosis not present

## 2015-01-20 DIAGNOSIS — I83812 Varicose veins of left lower extremities with pain: Secondary | ICD-10-CM

## 2015-01-20 DIAGNOSIS — M79606 Pain in leg, unspecified: Secondary | ICD-10-CM

## 2015-01-20 NOTE — Progress Notes (Signed)
[facility] [place] HISTORY AND PHYSICAL   History of Present Illness:  Patient is a 74 y.o. year old female who presents for evaluation of left leg pain. The patient has had a several week history of pain on the medial aspect of her left leg just below the knee. She denies any trauma to this area. She states it hurts sometimes at night time when she is trying to sleep. She states it does get better after she has been moving for a while. She states it hurts the most when she is trying to get in and out of a chair. She denies prior history of DVT. She is family history of varicose veins. She does not really describe joint pain. She states she has some occasional swelling in the left leg but this is subtle. She has never worn compression stockings in the past.  Other medical problems include hyperlipidemia and hypertension. These are currently controlled.  Past Medical History  Diagnosis Date  . Hyperlipidemia   . Hypertension   . Stroke 05/2011    no paralysis    Past Surgical History  Procedure Laterality Date  . Toe surgery  aprox. 1979    bilateral small toes  . Gums  2006    Social History History  Substance Use Topics  . Smoking status: Former Smoker    Quit date: 09/24/1992  . Smokeless tobacco: Never Used  . Alcohol Use: No    Family History Family History  Problem Relation Age of Onset  . Colon cancer Paternal Aunt   . Esophageal cancer Neg Hx   . Rectal cancer Neg Hx   . Stomach cancer Neg Hx   . Cancer Mother   . Heart disease Brother   . Hypertension Daughter     Allergies  No Known Allergies   Current Outpatient Prescriptions  Medication Sig Dispense Refill  . aspirin 325 MG tablet Take 325 mg by mouth daily.    . hydrochlorothiazide (HYDRODIURIL) 25 MG tablet Take 25 mg by mouth daily.    Marland Kitchen losartan (COZAAR) 50 MG tablet Take 50 mg by mouth daily.   0  . simvastatin (ZOCOR) 20 MG tablet Take 20 mg by mouth every evening.    .  pioglitazone (ACTOS) 45 MG tablet Take 45 mg by mouth daily. May be a placebo.  Pt. Is in a blind study.  She had a stroke 05/2011     No current facility-administered medications for this visit.    ROS:   General:  No weight loss, Fever, chills  HEENT: No recent headaches, no nasal bleeding, no visual changes, no sore throat  Neurologic: No dizziness, blackouts, seizures. No recent symptoms of stroke or mini- stroke. No recent episodes of slurred speech, or temporary blindness.  Cardiac: No recent episodes of chest pain/pressure, no shortness of breath at rest.  No shortness of breath with exertion.  Denies history of atrial fibrillation or irregular heartbeat  Vascular: No history of rest pain in feet.  No history of claudication.  No history of non-healing ulcer, No history of DVT   Pulmonary: No home oxygen, no productive cough, no hemoptysis,  No asthma or wheezing  Musculoskeletal:  [ ]  Arthritis, [ ]  Low back pain,  [ ]  Joint pain  Hematologic:No history of hypercoagulable state.  No history of easy bleeding.  No history of anemia  Gastrointestinal: No hematochezia or melena,  No gastroesophageal reflux, no trouble swallowing  Urinary: [ ]  chronic Kidney disease, [ ]   on HD - [ ]  MWF or [ ]  TTHS, [ ]  Burning with urination, [ ]  Frequent urination, [ ]  Difficulty urinating;   Skin: No rashes  Psychological: No history of anxiety,  No history of depression   Physical Examination  Filed Vitals:   01/20/15 1435  BP: 155/76  Pulse: 70  Height: 5\' 6"  (1.676 m)  Weight: 232 lb (105.235 kg)  SpO2: 98%    Body mass index is 37.46 kg/(m^2).  General:  Alert and oriented, no acute distress HEENT: Normal Neck: No bruit or JVD Pulmonary: Clear to auscultation bilaterally Cardiac: Regular Rate and Rhythm without murmur Abdomen: Soft, non-tender, non-distended, no mass Skin: No rash, faintly palpable and noticeable cluster of varicosities medial aspect left knee just below  the joint space Extremity Pulses:  2+ radial, brachial, femoral, dorsalis pedis pulses bilaterally Musculoskeletal: No deformity or edema  Neurologic: Upper and lower extremity motor 5/5 and symmetric  DATA:  She had a left leg venous reflux exam today. This did show one short area of reflux in the mid leg but vein diameter was only 2.7 mm. She did have some evidence of mild deep vein reflux as well.   ASSESSMENT:  Mild reflux left lower extremity however no significant dilation of the vein.   PLAN:  Recommend lower extremity compression stockings thigh-high 20-30 mmHg. Patient will also take nonsteroidal anti-inflammatories for 7-10 days to see if this reduces the pain in her leg. She will also elevate her legs to reduce pain. I did discuss with her the possibility of stab avulsions of these veins but also informed her that most likely this would be considered cosmetic in nature due to the small diameter of the veins in general. I also told her to 4 week and consider any type of vein stripping procedure we would need to consider whether or not this might be an orthopedic problem as well. She will try the compression stockings and nonsteroidals for now and see if this improves her symptoms. Follow-up on as-needed basis.  Ruta Hinds, MD [facility] [place] Office: [phone] Pager: [phone]

## 2015-02-14 IMAGING — US US CAROTID DUPLEX BILAT
1 series · 13 of 24 positions shown · non-contrast
Comparison: MRA 06/07/2011

CLINICAL DATA: History of CVA (cerebrovascular accident),
hypertension, previous tobacco abuse

EXAM:
BILATERAL CAROTID DUPLEX ULTRASOUND
TECHNIQUE: Gray scale imaging, color Doppler and duplex ultrasound was
performed of bilateral carotid and vertebral arteries in the neck.

[Series 1: us carotid duplex bilat · 0.08mm/px · 13 of 61 slices shown]
[im 1/61]
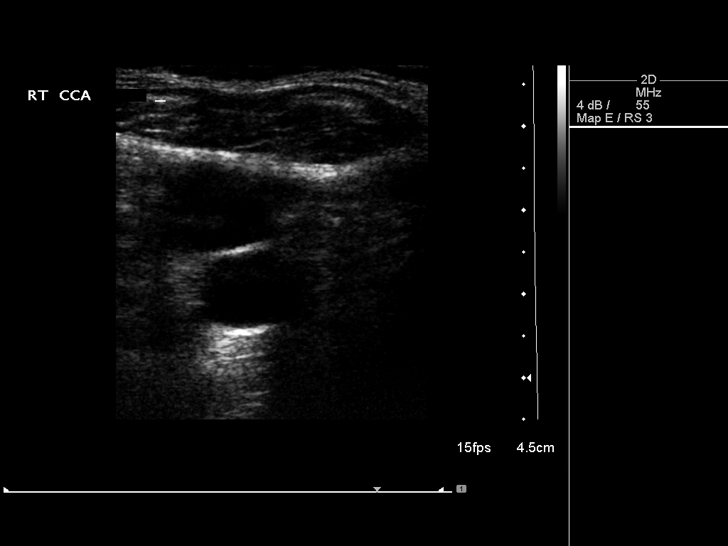
[im 6/61]
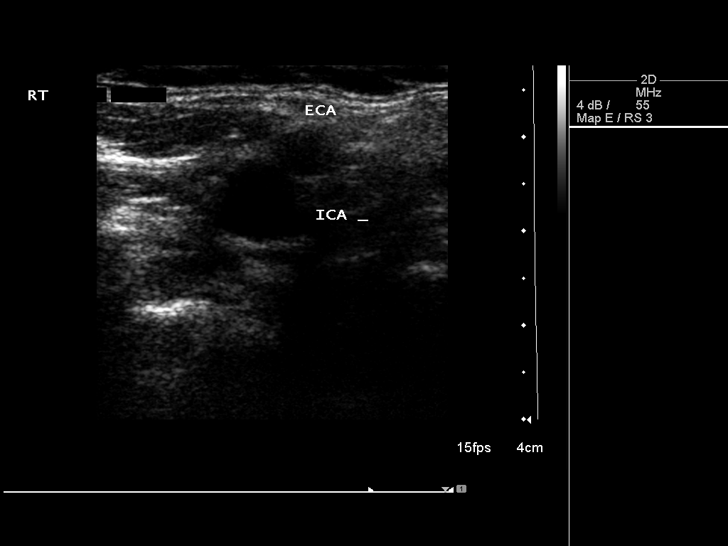
[im 11/61]
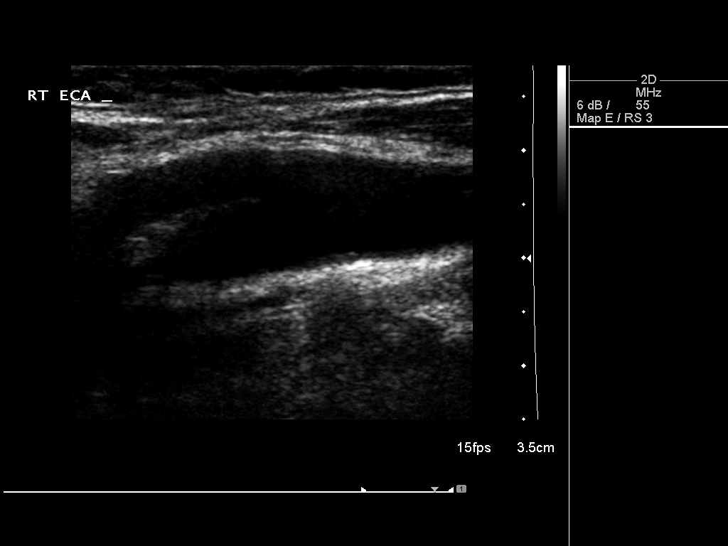
[im 16/61]
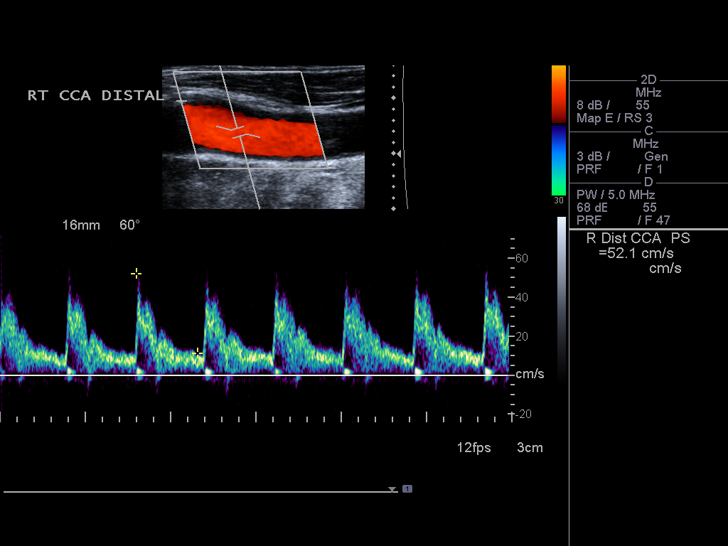
[im 21/61]
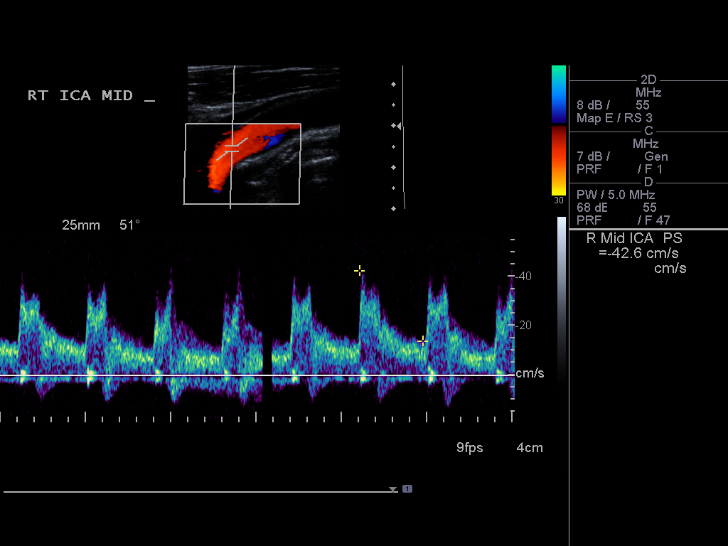
[im 27/61]
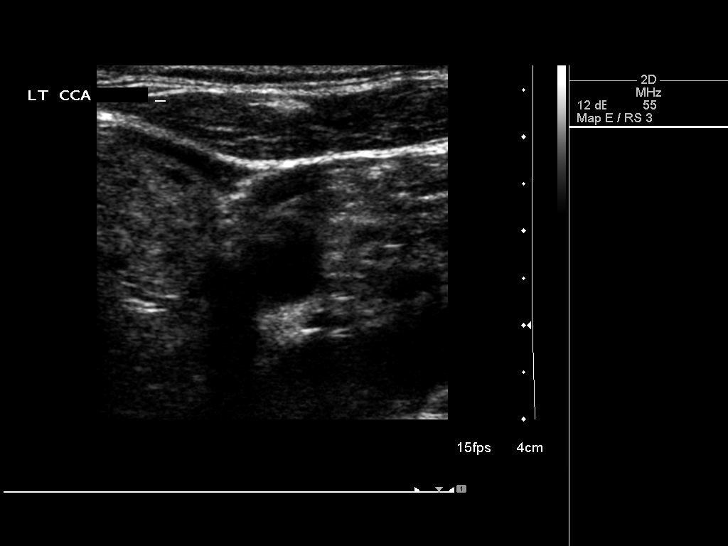
[im 32/61]
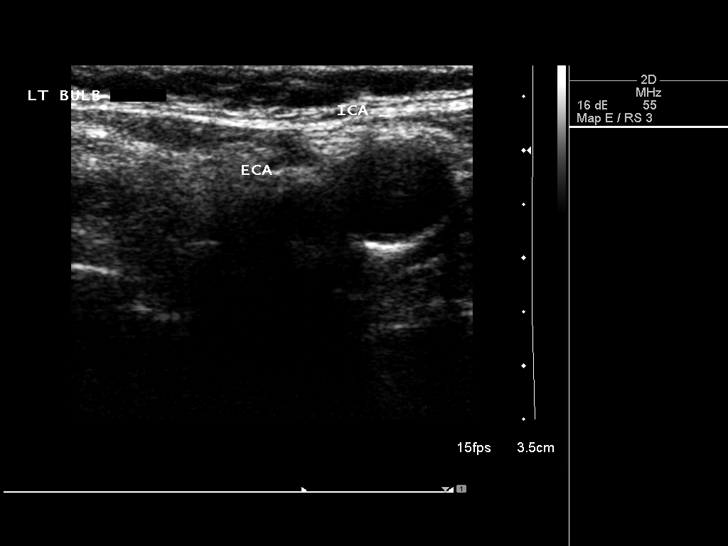
[im 34/61]
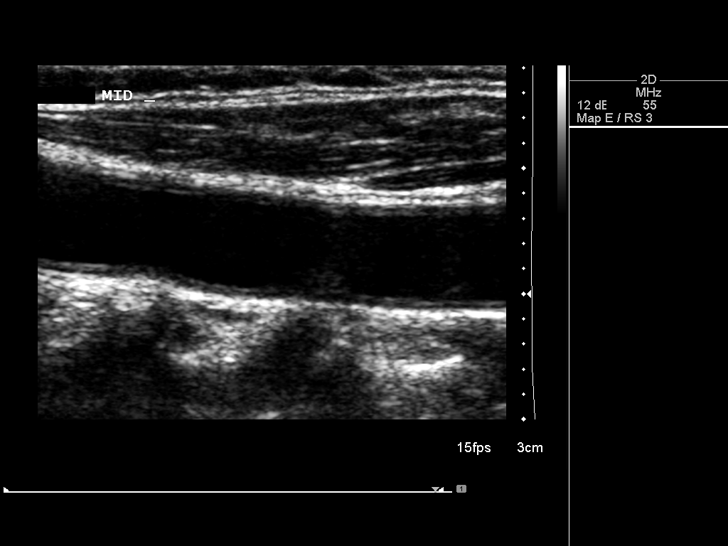
[im 40/61]
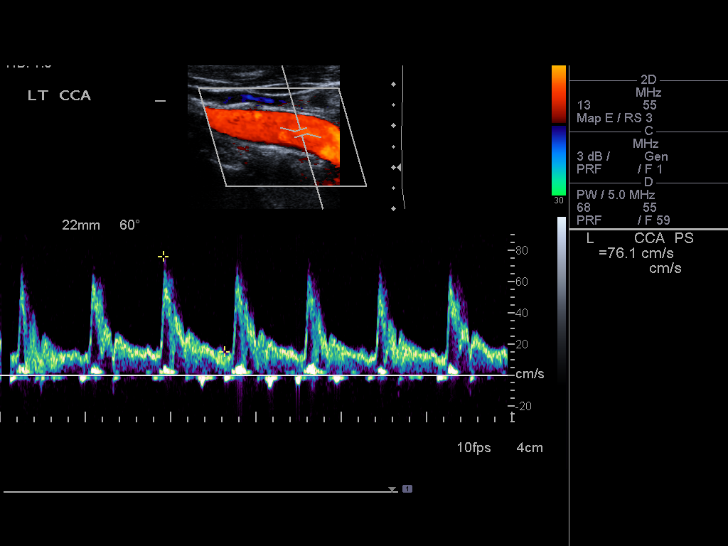
[im 45/61]
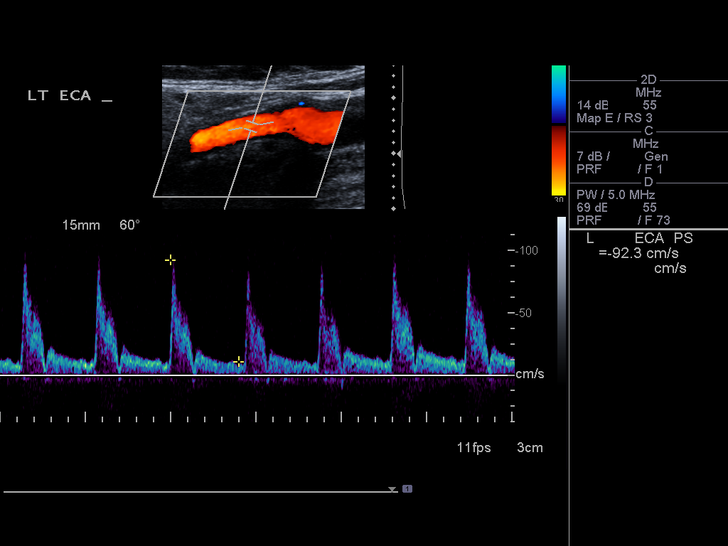
[im 50/61]
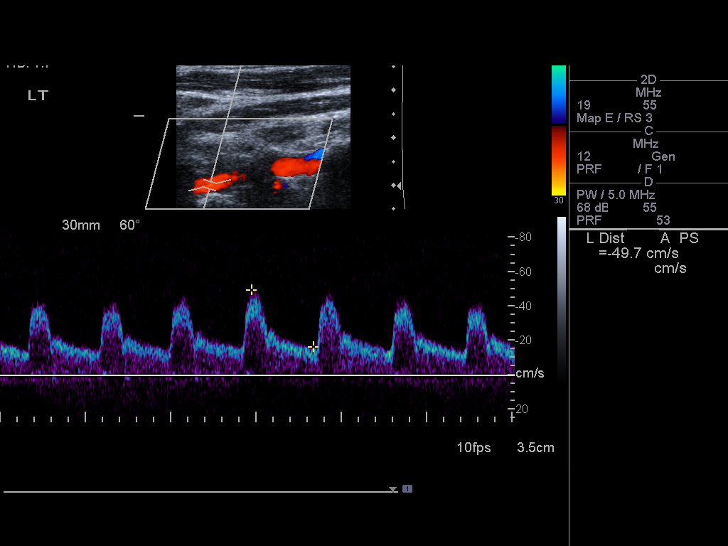
[im 55/61]
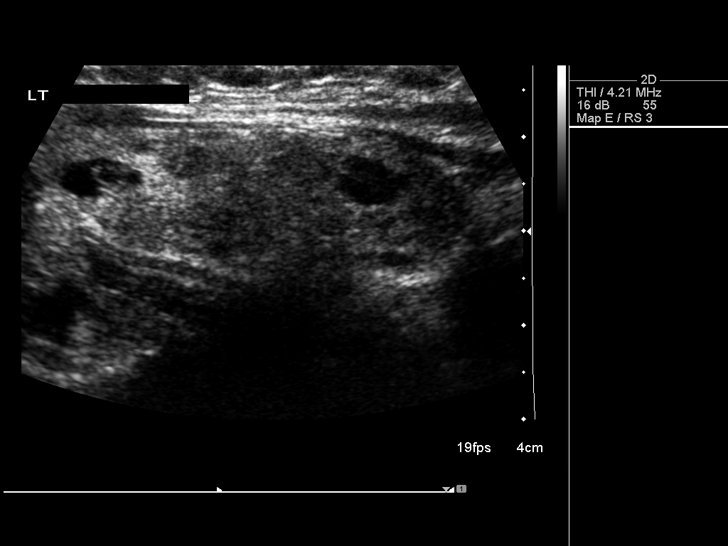
[im 61/61]
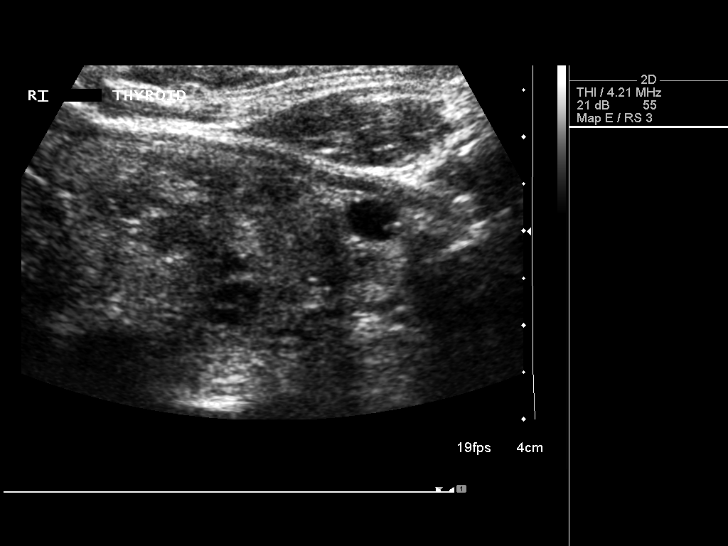

[13 of 24 positions shown; findings below may reference images not displayed]

REVIEW OF SYSTEMS:
Quantification of carotid stenosis is based on velocity parameters
that correlate the residual internal carotid diameter with
NASCET-based stenosis levels, using the diameter of the distal
internal carotid lumen as the denominator for stenosis measurement.

The following velocity measurements were obtained:

PEAK SYSTOLIC/END DIASTOLIC

RIGHT

ICA:                       48/17cm/sec

CCA:                     86/12cm/sec

SYSTOLIC ICA/CCA RATIO:

DIASTOLIC ICA/CCA RATIO:

ECA:                     105cm/sec

LEFT

ICA:                     86/23cm/sec

CCA:                     76/15cm/sec

SYSTOLIC ICA/CCA RATIO:

DIASTOLIC ICA/CCA RATIO:

ECA:                     92cm/sec
FINDINGS: RIGHT CAROTID ARTERY: Mild plaque in the carotid bulb. There is
eccentric partially calcified plaque in the proximal ICA. No
high-grade stenosis. Normal waveforms and color Doppler signal.

RIGHT VERTEBRAL ARTERY:  Normal flow direction and waveform.

LEFT CAROTID ARTERY: Smooth eccentric partially calcified plaque in
the carotid bulb extending to the proximal ICA without high-grade
stenosis. Normal waveforms and color Doppler signal.

LEFT VERTEBRAL ARTERY: Normal flow direction and waveform.

Multiple low-attenuation sub cm bilateral thyroid lesions are
identified.
IMPRESSION: 1. Mild bilateral carotid bifurcation and proximal ICA plaque,
resulting in less than 50% diameter stenosis. The exam does not
exclude plaque ulceration or embolization. Continued surveillance
recommended.
2. Bilateral small thyroid lesions, incompletely characterized.

## 2015-02-28 ENCOUNTER — Encounter: Payer: Self-pay | Admitting: Neurology

## 2015-02-28 ENCOUNTER — Ambulatory Visit (INDEPENDENT_AMBULATORY_CARE_PROVIDER_SITE_OTHER): Payer: Medicare Other | Admitting: Neurology

## 2015-02-28 VITALS — BP 132/83 | HR 67 | Wt 235.0 lb

## 2015-02-28 DIAGNOSIS — G3184 Mild cognitive impairment, so stated: Secondary | ICD-10-CM | POA: Diagnosis not present

## 2015-02-28 DIAGNOSIS — G450 Vertebro-basilar artery syndrome: Secondary | ICD-10-CM

## 2015-02-28 DIAGNOSIS — G459 Transient cerebral ischemic attack, unspecified: Secondary | ICD-10-CM | POA: Diagnosis not present

## 2015-02-28 DIAGNOSIS — R413 Other amnesia: Secondary | ICD-10-CM | POA: Diagnosis not present

## 2015-02-28 DIAGNOSIS — Z9189 Other specified personal risk factors, not elsewhere classified: Secondary | ICD-10-CM

## 2015-02-28 MED ORDER — CVS FISH OIL 1000 MG PO CAPS: 1.0000 | ORAL_CAPSULE | ORAL | Status: DC

## 2015-02-28 NOTE — Patient Instructions (Signed)
I had a long discussion with the patient and her daughter regarding her memory loss and mild cognitive difficulties which are likely due to mild cognitive impairment. I encouraged her to participate in in activities for cognitive challenge like playing crossword, bridge, sudoku and start taking fish oil 1000mg  daily as well as Reservetarol 200 mg daily. Check MRI scan of the brain, MRA of the brain and neck given prior history of TIAs to look for silent cerebrovascular disease as well as EEG and dementia panel labs and polysomnogram to look for reversible causes of cognitive impairment. Continue aspirin for stroke prevention and strict control of hypertension with blood pressure goal below 130/90, lipids with LDL cholesterol goal below 70 mg percent and diabetes with hemoglobin A1c goal below 6.5%. Return for follow-up in 2 months or call earlier if necessary.  Mild Neurocognitive Disorder Mild neurocognitive disorder (formerly known as mild cognitive impairment) is a mental disorder. It is a slight abnormal decrease in mental function. The areas of mental function affected may include memory, thought, communication, behavior, and completion of tasks. The decrease is noticeable and measurable but for the most part does not interfere with your daily activities. Mild neurocognitive disorder typically occurs in people older than 60 years but can occur earlier. It is not as serious as major neurocognitive disorder (formerly known as dementia) but may lead to a more serious neurocognitive disorder. However, in some cases the condition does not get worse. A few people with this disorder even improve. CAUSES  There are a number of different causes of mild neurocognitive disorder:   Brain disorders associated with abnormal protein deposits, such as Alzheimer's disease, Pick's disease, and Lewy body disease.  Brain disorders associated with abnormal movement, such as Parkinson's disease and Huntington's  disease.  Diseases affecting blood vessels in the brain and resulting in mini-strokes.  Certain infections, such as human immunodeficiency virus (HIV) infection.  Traumatic brain injury.  Other medical conditions such as brain tumors, underactive thyroid (hypothyroidism), and vitamin B12 deficiency.  Use of certain prescription medicine and "recreational" drugs. SYMPTOMS  Symptoms of mild neurocognitive disorder include:  Difficulty remembering. You may forget details of recent events, names, or phone numbers. You may forget important social events and appointments or repeatedly forget where you put your car keys.  Difficulty thinking and solving problems. You may have trouble with complex tasks such as paying bills or driving in unfamiliar locations.  Difficulty communicating. You may have trouble finding the right word, naming an object, forming a sentence that makes sense, or understanding what you read or hear.  Changes in your behavior or personality. You may lose interest in the things that you used to enjoy or withdraw from social situations. You may get angry more easily than usual. You may act before thinking. You may do things in public that you would not usually do. You may hear or see things that are not real (hallucinations). You may believe falsely that others are trying to hurt you (paranoia). DIAGNOSIS Mild neurocognitive disorder is diagnosed through an assessment by your health care provider. Your health care provider will ask you and your family, friends, or coworkers questions about your symptoms. He or she will ask how often the symptoms occur, how long they have been occurring, whether they are getting worse, and the effect they are having on your life. Your health care provider may refer you to a neurologist or mental health specialist for a detailed evaluation of your mental functions (neuropsychological testing).  To identify the cause of your mild neurocognitive  disorder, your health care provider may:  Obtain a detailed medical history.  Ask about alcohol and drug use, including prescription medicine.  Perform a physical exam.  Order blood tests and brain imaging exams. TREATMENT  Mild neurocognitive disorder caused by infections, use of certain medicines or "recreational" drugs, and certain medical conditions may improve with treatment of the condition that is causing the disorder. Mild neurocognitive disorder resulting from other causes generally does not improve and may worsen. In these cases, the goal of treatment is to slow progression of the disorder and help you cope with the loss of mental function. Treatments in these cases include:   Medicine. Medicine helps mainly with memory loss and behavioral symptoms.   Talk therapy. Talk therapy provides education, emotional support, memory aids, and other ways of making up for decreases in mental function.   Lifestyle changes. These include regular exercise, a healthy diet (including essential omega-3 fatty acids), intellectual stimulation, and increased social interaction. Document Released: 05/13/2013 Document Revised: 01/25/2014 Document Reviewed: 05/13/2013 [facility] Patient Information 2015 [place], [place]. This information is not intended to replace advice given to you by your health care provider. Make sure you discuss any questions you have with your health care provider.

## 2015-02-28 NOTE — Progress Notes (Signed)
[facility] [address] [place]. [place] [address] [phone]       OFFICE CONSULT NOTE  [name] Date of Birth:  01-20-1941 Medical Record Number:  [id number]   Referring MD: Cammie Fulp Reason for Referral:  Memory loss  HPI: 10 year pleasant lady who is accompanied by her daughter today provides most of her history. She's been having memory issues for the last few years. She misplaces objects as well as forgets her task. She occasionally stops in mid sentences and has to search for words to complete sentences. She however lives alone and is independent in activities of daily living. At times the daughter feels that her mother does not process and understand her if she is not directly facing her. She denies any headaches, and difficulty with walking, balance, behavioral issues, agitation, hallucinations or delusions. There is no family history of Alzheimer's dementia. 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 She was treated conservatively with medical therapy. She in fact participated in the IRIS study and was randomized to the placebo arm. She remains on aspirin for stroke prevention and states her blood pressure is well controlled as well as cholesterol. Blood pressure today is 132/83 today. She does complain of restless sleep and he feels tired and during the day. She does snore but there have been no witnessed sleep apnea episodes. She has not been evaluated for sleep apnea yet.  ROS:   14 system review of systems is positive for  weight gain, fatigue, hearing loss, easy bruising, joint pain, aching muscles, memory loss, decreased energy, insomnia, restless legs and all other systems negative.  PMH:  Past Medical History  Diagnosis Date  . Hyperlipidemia   . Hypertension   . Stroke 05/2011    no  paralysis  . Memory loss     Social History:  History   Social History  . Marital Status: Widowed    Spouse Name: N/A  . Number of Children: 2  . Years of Education: 15   Occupational History  . worked for [employer] History Main Topics  . Smoking status: Former Smoker    Quit date: 09/24/1992  . Smokeless tobacco: Never Used  . Alcohol Use: No  . Drug Use: No  . Sexual Activity: Not on file   Other Topics Concern  . Not on file   Social History Narrative   Lives alone   Left handed   caffeine use -  2 cups coffee daily, tea 2 glasses daily    Medications:   Current Outpatient Prescriptions on File Prior to Visit  Medication Sig Dispense Refill  . aspirin 325 MG tablet Take 325 mg by mouth daily.    . hydrochlorothiazide (HYDRODIURIL) 25 MG tablet Take 25 mg by mouth daily.    Marland Kitchen losartan (COZAAR) 50 MG tablet Take 50 mg by mouth daily.   0  . simvastatin (ZOCOR) 20 MG tablet Take 20 mg by mouth every evening.     No current facility-administered medications on file prior to visit.    Allergies:  No Known Allergies  Physical Exam General: well developed, well nourished, seated, in no evident distress Head: head normocephalic and atraumatic.   Neck: supple with no carotid or supraclavicular bruits Cardiovascular: regular rate and rhythm, no murmurs Musculoskeletal: no deformity  Skin:  no rash/petichiae Vascular:  Normal pulses all extremities  Neurologic Exam Mental Status: Awake and fully alert. Oriented to place and time. Recent and remote memory diminished. Attention span, concentration and fund of knowledge appropriate. Mood and affect appropriate. Mini-Mental status exam scored 29/30 with 1 deficit in recall. Montreal cognitive assessment ( MOCA ) score 25/30 with deficits in clock drawing, and delayed recall only. Cranial Nerves: Fundoscopic exam reveals sharp disc margins. Pupils equal, briskly reactive to light. Extraocular movements full  without nystagmus. Visual fields full to confrontation. Hearing intact. Facial sensation intact. Face, tongue, palate moves normally and symmetrically.  Motor: Normal bulk and tone. Normal strength in all tested extremity muscles. Sensory.: intact to touch , pinprick , position and vibratory sensation.  Coordination: Rapid alternating movements normal in all extremities. Finger-to-nose and heel-to-shin performed accurately bilaterally. Gait and Station: Arises from chair without difficulty. Stance is normal. Gait demonstrates normal stride length and balance . Able to heel, toe and tandem walk without difficulty.  Reflexes: 1+ and symmetric. Toes downgoing.       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 Remote history of posterior circulation TIAs with vascular risk factors of hypertension, hyperlipidemia, extracranial vertebral artery stenosis.     PLAN: I had a long discussion with the patient and her daughter regarding her memory loss and mild cognitive difficulties which are likely due to mild cognitive impairment. I encouraged her to participate in in activities for cognitive challenge like playing crossword, bridge, sudoku and start taking fish oil 1000mg  daily as well as Reservetarol 200 mg daily. Check MRI scan of the brain, MRA of the brain and neck given prior history of TIAs to look for silent cerebrovascular disease as well as EEG and dementia panel labs and polysomnogram to look for reversible causes of cognitive impairment. Continue aspirin for stroke prevention and strict control of hypertension with blood pressure goal below 130/90, lipids with LDL cholesterol goal below 70 mg percent and diabetes with hemoglobin A1c goal below 6.5%. Return for follow-up in 2 months or call earlier if necessary.  Antony Contras, MD  Note: This document was prepared with digital dictation and  possible smart phrase technology. Any transcriptional errors that result from this process are unintentional.

## 2015-03-01 LAB — CREATININE, SERUM
CREATININE: 0.71 mg/dL (ref 0.57–1.00)
GFR, EST AFRICAN AMERICAN: 98 mL/min/{1.73_m2} (ref >59)
GFR, EST NON AFRICAN AMERICAN: 85 mL/min/{1.73_m2} (ref >59)

## 2015-03-01 LAB — BUN: BUN: 14 mg/dL (ref 8–27)

## 2015-03-02 LAB — DEMENTIA PANEL
HOMOCYSTEINE: 8.8 umol/L (ref 0.0–15.0)
RPR: NONREACTIVE
TSH: 1.72 u[IU]/mL (ref 0.450–4.500)
VITAMIN B 12: 626 pg/mL (ref 211–946)

## 2015-03-03 ENCOUNTER — Telehealth: Payer: Self-pay | Admitting: *Deleted

## 2015-03-03 NOTE — Telephone Encounter (Signed)
-----   Message from Garvin Fila, MD sent at 03/03/2015  8:44 AM EDT ----- Mitchell Heir inform patient that lab work for reversible causes of memory loss was unremarkable

## 2015-03-03 NOTE — Telephone Encounter (Signed)
Left detailed vm informing patient , per Dr Leonie Man, of unremarkable lab results for reversible causes of memory loss. Left this caller's name, number for any questions.

## 2015-03-05 IMAGING — US US SOFT TISSUE HEAD/NECK
1 series · 13 of 25 positions shown · non-contrast
Comparison: None.

CLINICAL DATA: NODULES SEEN ON CAROTID DOPPLER

EXAM:
THYROID ULTRASOUND
TECHNIQUE: Ultrasound examination of the thyroid gland and adjacent soft
tissues was performed.

[Series 1: us soft tissue head/neck · 0.08mm/px · 13 of 66 slices shown]
[im 1/66]
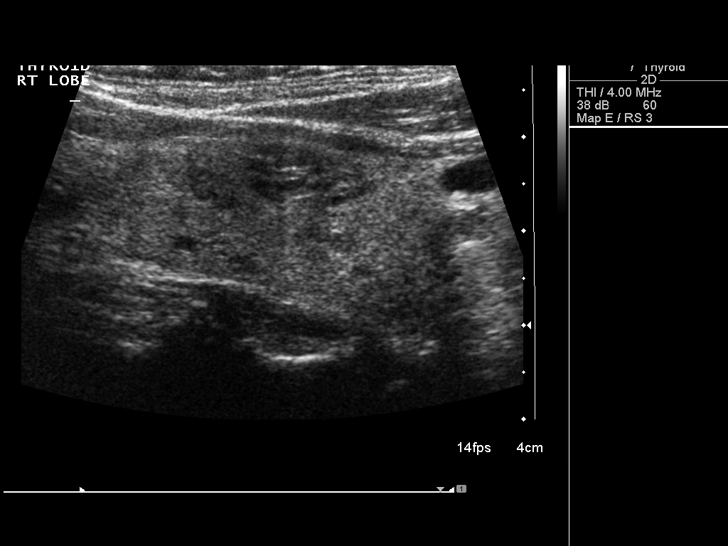
[im 6/66]
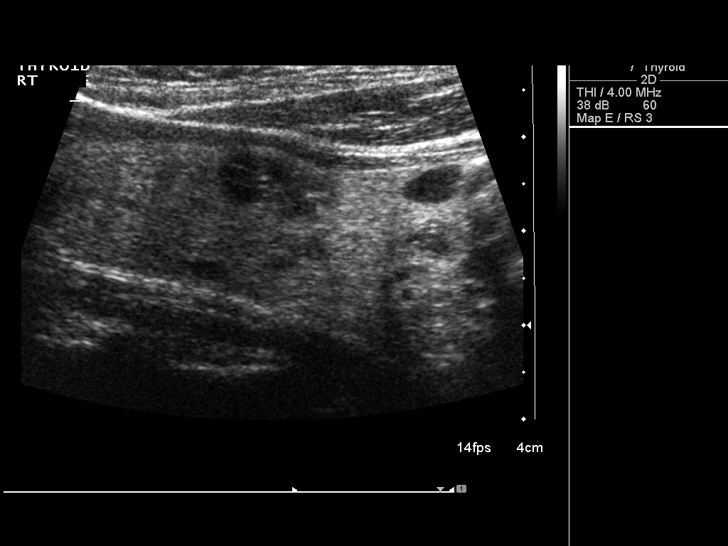
[im 11/66]
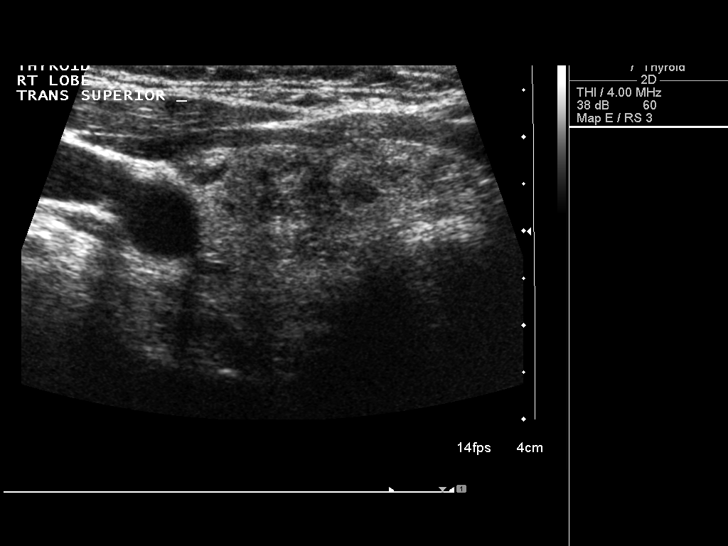
[im 17/66]
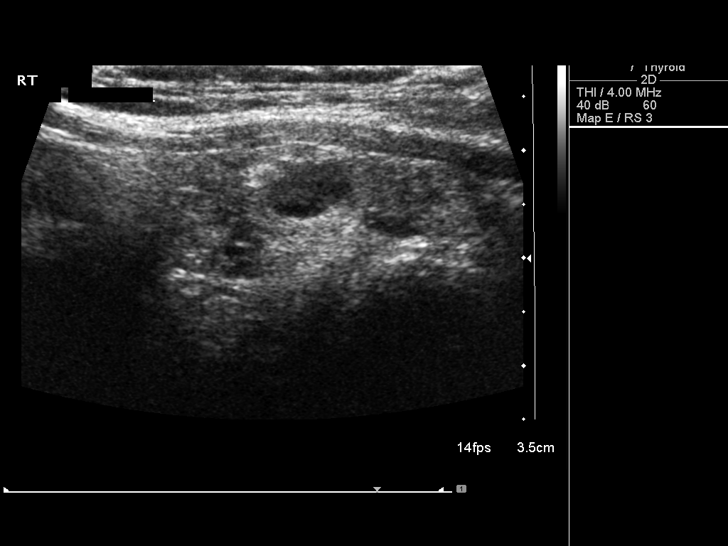
[im 22/66]
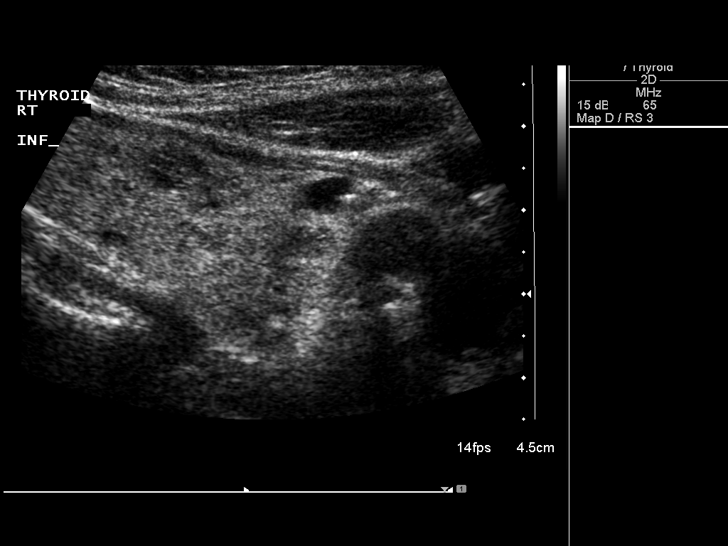
[im 28/66]
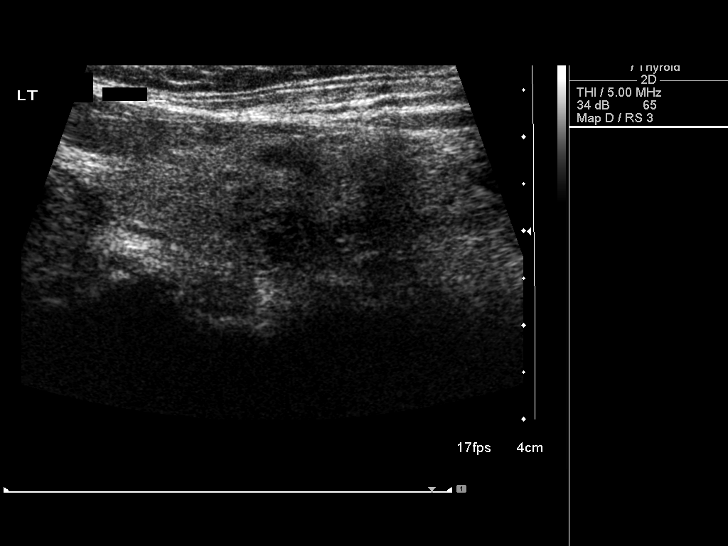
[im 33/66]
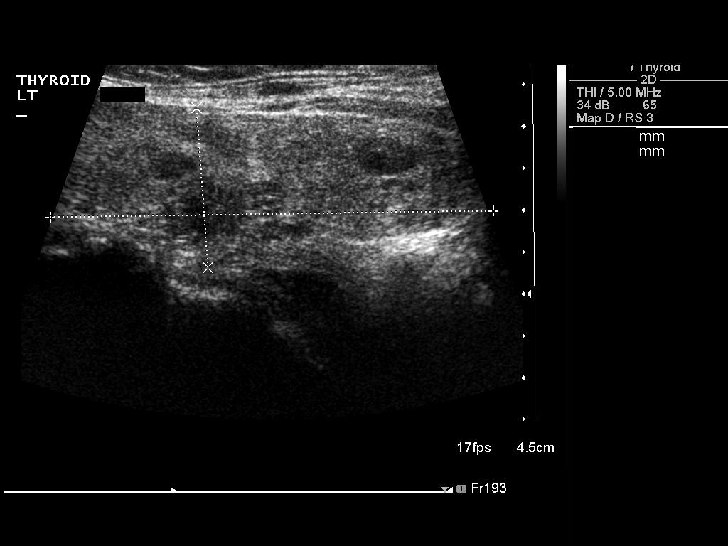
[im 38/66]
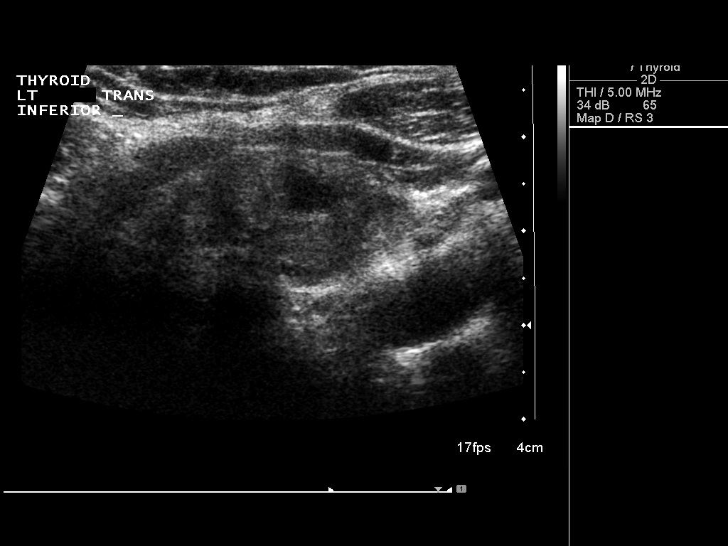
[im 44/66]
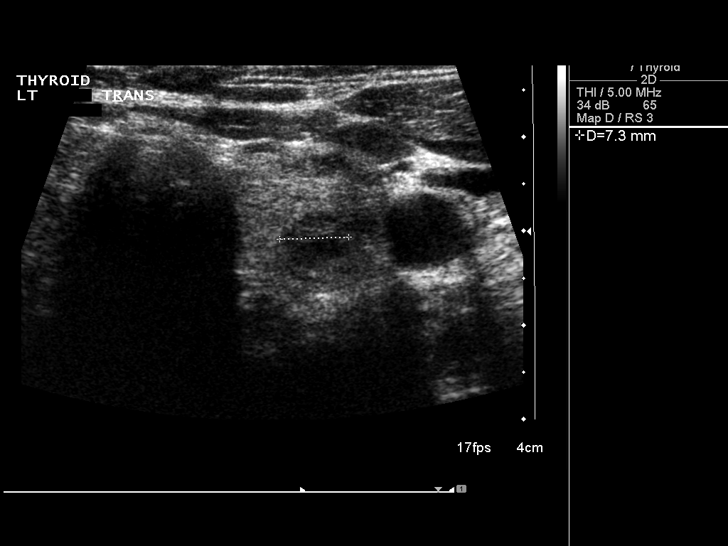
[im 49/66]
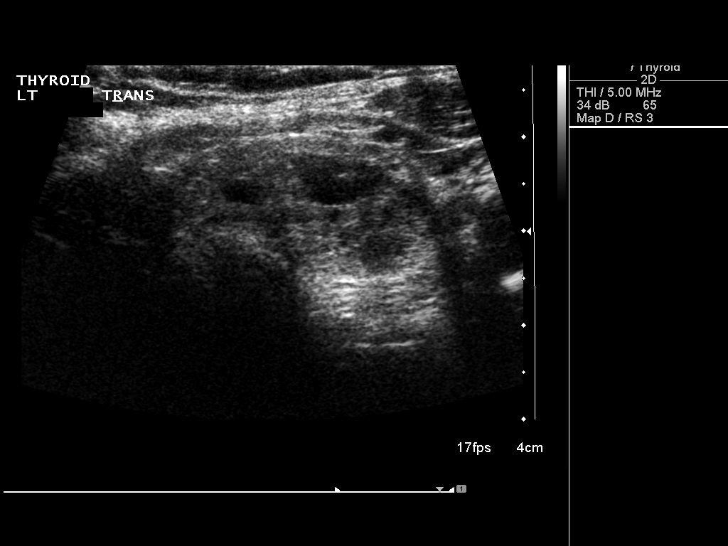
[im 55/66]
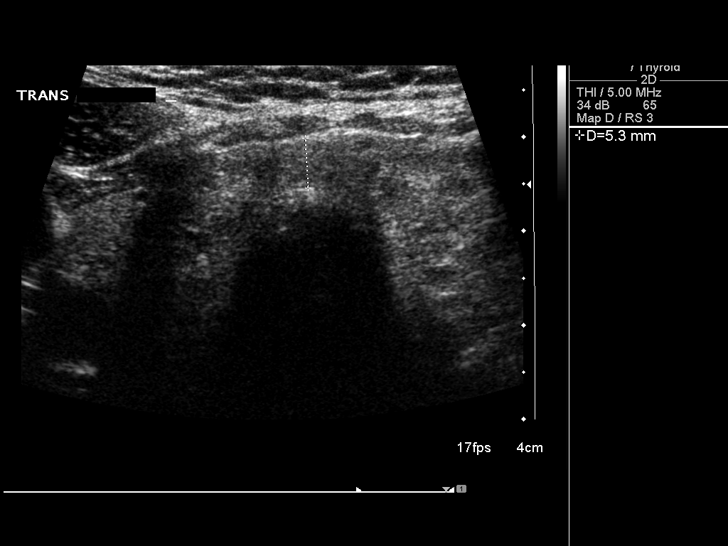
[im 60/66]
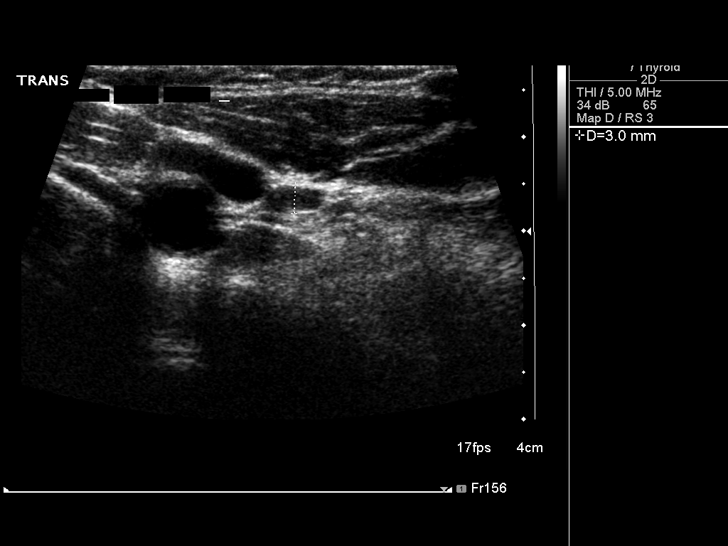
[im 66/66]
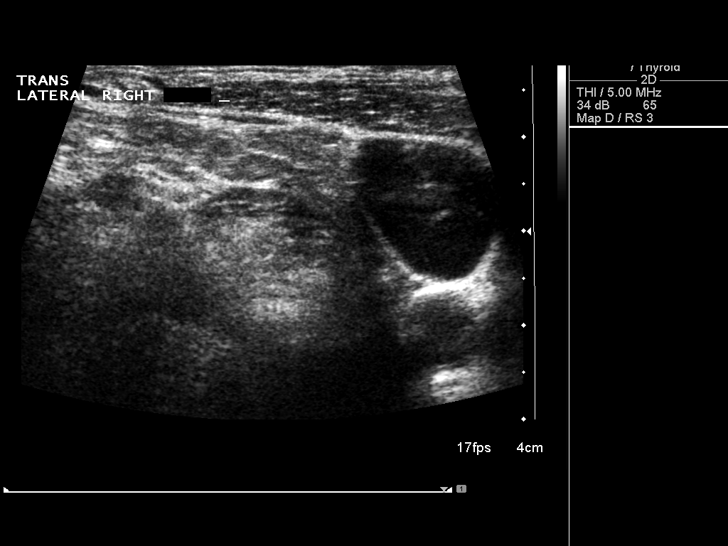

[13 of 25 positions shown; findings below may reference images not displayed]

FINDINGS: Right thyroid lobe

Measurements: 47 x 18 x 21 mm. Inhomogeneous echotexture with
multiple small nodules. Largest 9 x 7 x 9 mm, mid lobe. There are 8
x 5 x 7 mm and 6 x 4 x 7 mm complex cysts, inferior pole.

Left thyroid lobe

Measurements: 54 x 19 x 17 mm. Heterogeneous nodular echotexture. 9
nodule, superior pole. 8 x 6 x 10 mm complex cyst, inferior pole.

Isthmus

Thickness: 5 mm.  No nodules visualized.

Lymphadenopathy

None visualized.
IMPRESSION: 1. Thyromegaly with small bilateral nodules. Findings do not meet
current consensus criteria for biopsy. Follow-up by clinical exam is
recommended. If patient has known risk factors for thyroid
carcinoma, consider follow-up ultrasound in 12 months. If patient is
clinically hyperthyroid, consider nuclear medicine thyroid uptake
and scan. This recommendation follows the consensus statement:
Management of Thyroid Nodules Detected as US: Society of
Radiologists in Ultrasound Consensus Conference Statement. Radiology

## 2015-03-08 ENCOUNTER — Ambulatory Visit (INDEPENDENT_AMBULATORY_CARE_PROVIDER_SITE_OTHER): Payer: Medicare Other | Admitting: Neurology

## 2015-03-08 DIAGNOSIS — R413 Other amnesia: Secondary | ICD-10-CM | POA: Diagnosis not present

## 2015-03-08 DIAGNOSIS — G3184 Mild cognitive impairment, so stated: Secondary | ICD-10-CM

## 2015-03-08 NOTE — Procedures (Signed)
    History:  Yesenia Kramer is a 74 year old patient with a history of mild cognitive slowing. The patient has episodes of forgetfulness, misplacing things about the house. The patient is being evaluated for these events.  This is a routine EEG. No skull defects are noted. Medications include aspirin, hydrochlorothiazide, Cozaar, fish oil, and Zocor.   EEG classification: Normal awake  Description of the recording: The background rhythms of this recording consists of a fairly well modulated medium amplitude alpha rhythm of 9 Hz that is reactive to eye opening and closure. As the record progresses, the patient appears to remain in the waking state throughout the recording. Photic stimulation was not performed due to technical difficulties. Hyperventilation was performed, resulting in a minimal buildup of the background rhythm activities without significant slowing seen. At no time during the recording does there appear to be evidence of spike or spike wave discharges or evidence of focal slowing. EKG monitor shows no evidence of cardiac rhythm abnormalities with a heart rate of 66.  Impression: This is a normal EEG recording in the waking state. No evidence of ictal or interictal discharges are seen.

## 2015-03-11 ENCOUNTER — Other Ambulatory Visit: Payer: Medicare Other

## 2015-03-11 ENCOUNTER — Ambulatory Visit
Admission: RE | Admit: 2015-03-11 | Discharge: 2015-03-11 | Disposition: A | Discharge status: Home / Self Care | Payer: Medicare Other | Source: Ambulatory Visit | Attending: Neurology | Admitting: Neurology

## 2015-03-11 DIAGNOSIS — G3184 Mild cognitive impairment, so stated: Secondary | ICD-10-CM

## 2015-03-11 DIAGNOSIS — G450 Vertebro-basilar artery syndrome: Secondary | ICD-10-CM

## 2015-03-11 DIAGNOSIS — G459 Transient cerebral ischemic attack, unspecified: Secondary | ICD-10-CM

## 2015-03-11 DIAGNOSIS — R413 Other amnesia: Secondary | ICD-10-CM

## 2015-03-11 MED ORDER — GADOBENATE DIMEGLUMINE 529 MG/ML IV SOLN
20.0000 mL | Freq: Once | INTRAVENOUS | Status: AC | PRN
  Administered 2015-03-11: 20 mL via INTRAVENOUS

## 2015-03-14 ENCOUNTER — Telehealth: Payer: Self-pay | Admitting: Neurology

## 2015-03-14 NOTE — Telephone Encounter (Signed)
Kindly Call patient and give normal EEG results

## 2015-03-14 NOTE — Telephone Encounter (Signed)
Patient is calling to get the results of her EEG test.  Please call.

## 2015-03-15 ENCOUNTER — Telehealth: Payer: Self-pay | Admitting: *Deleted

## 2015-03-15 NOTE — Telephone Encounter (Signed)
-----   Message from Garvin Fila, MD sent at 03/15/2015  2:56 PM EDT ----- Mitchell Heir inform the patient that MRA of the brain shows no significant narrowing and MRA of the neck shows moderate narrowing of the right vertebral artery but no significant change compared with prior study. Continue present management no changes in treatment necessary at the current time.

## 2015-03-15 NOTE — Telephone Encounter (Signed)
Spoke with patient and informed her that per Dr Leonie Man, EEG results are normal. She asked about MRI she had done 03/11/15; informed her it could be a week to 10 days before she hears results. She verbalized understanding, appreciation.

## 2015-03-15 NOTE — Telephone Encounter (Signed)
Spoke with patient and gave her detailed results' reports of her MRI brain, MRA Brain/Neck per Dr Leonie Man. She verbalized understanding, appreciation. She confirmed her FU in Aug.

## 2015-04-12 ENCOUNTER — Institutional Professional Consult (permissible substitution): Payer: Medicare Other | Admitting: Neurology

## 2015-04-13 DIAGNOSIS — N3000 Acute cystitis without hematuria: Secondary | ICD-10-CM | POA: Diagnosis not present

## 2015-04-13 DIAGNOSIS — R399 Unspecified symptoms and signs involving the genitourinary system: Secondary | ICD-10-CM | POA: Diagnosis not present

## 2015-04-19 ENCOUNTER — Institutional Professional Consult (permissible substitution): Payer: Medicare Other | Admitting: Neurology

## 2015-04-20 ENCOUNTER — Institutional Professional Consult (permissible substitution): Payer: Medicare Other | Admitting: Neurology

## 2015-04-28 ENCOUNTER — Encounter: Payer: Self-pay | Admitting: Neurology

## 2015-04-28 ENCOUNTER — Ambulatory Visit (INDEPENDENT_AMBULATORY_CARE_PROVIDER_SITE_OTHER): Payer: Medicare Other | Admitting: Neurology

## 2015-04-28 VITALS — BP 142/82 | HR 88 | Resp 20 | Ht 66.0 in | Wt 237.0 lb

## 2015-04-28 DIAGNOSIS — R0683 Snoring: Secondary | ICD-10-CM

## 2015-04-28 DIAGNOSIS — I63131 Cerebral infarction due to embolism of right carotid artery: Secondary | ICD-10-CM

## 2015-04-28 DIAGNOSIS — G3184 Mild cognitive impairment, so stated: Secondary | ICD-10-CM | POA: Diagnosis not present

## 2015-04-28 NOTE — Progress Notes (Signed)
SLEEP MEDICINE CLINIC   Provider:  Larey Seat, M D  Referring Provider:  Antony Contras , [place] Team  Primary Care Physician:  Antony Blackbird, MD  Chief Complaint  Patient presents with  . sleep consult    snoring, pt doesn't think she has sleep apnea, rm 41, daughter    HPI:  Yesenia Kramer is a 74 y.o. female , seen here as a referral from [place], once a stroke patient to be evaluated for the presence of possible obstructive sleep apnea.  Yesenia Kramer;  64 year pleasant lady who is accompanied by her daughter today provides most of her history. She's been having memory issues for the last few years. She misplaces objects as well as forgets her task. She occasionally stops in mid sentences and has to search for words to complete sentences. She however lives alone and is independent in activities of daily living. At times the daughter feels that her mother does not process and understand her if she is not directly facing her. She denies any headaches, and difficulty with walking, balance, behavioral issues, agitation, hallucinations or delusions. There is no family history of Alzheimer's dementia. 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 She was treated conservatively with medical therapy. She in fact participated in the IRIS study and was randomized to the placebo arm. She remains on aspirin for stroke prevention and states her blood pressure is well controlled as well as cholesterol. Blood pressure today is 132/83 today. She does complain of restless sleep and he feels tired and during the day. She does snore but there have been no witnessed sleep apnea episodes. She has not been evaluated for sleep apnea yet.  Yesenia Kramer is personally unaware if she has sleep apnea. She knows she has been told that she snores, and she reports frequent  nocturia and insomnia. She is able to fall asleep but not always stays asleep through the night and if she has a bathroom break or wakes up for other reasons she sometimes has difficulties to initiate sleep again. She denies the irresistible urge to move which would be characterized as restless leg syndrome. She has residual left sided weakness a feeling of heaviness or more fatigability in her left body half. The leg more than the arm. Yesenia Kramer sleep habits are as follows. She most often she goes to bed between 10 and 11 PM but there are exceptions if she watches something interesting on TV etc. He she usually falls asleep promptly she feels that her best sleep is in the early hours of sleep. After couple of hours of sleep she usually has to go to the bathroom and this repeats itself throughout the night. She sleeps on her side.  It is than that she has trouble going back to sleep. He describes her bedroom is cool but not always quiet and dark since she occasionally watches TV in the bedroom. She does use 3 pillows.  She states that she will wake up sometimes spontaneously between 5 and 6 AM but usually doesn't rise immediately. She will stay in bed until 8 or 8:30 AM. She often gets another hour of sleep in that period of time. She estimates that she does not sleep for more than 6 hours at night -mostly less. Her sleep is restless, she recalls rarely a  dream. She does not wake up with palpitations or feeling diaphoretic. She does not sleep walk or to talk and had never has. There is no history of night terrors.  She describes herself as not being a morning person, but she is usually somewhat refreshed and restored and she finally rises at 8 or thereabouts. She will drink 2 cups of coffee in the morning, he would drink sweetened and iced tea at lunch and dinner.  Ultimately does she nap in daytime. Depends on how much sleep she got the night before, if she feels tired she would take a 30-60 minute nap  but again this is rare.  She never had any ENT procedures in the past nor any cervical or neck surgery she had oral surgery on her teeth but not no surgeries that change the craniofacial base.    ROS:   14 system review of systems is positive for  weight gain, fatigue, hearing loss, easy bruising, joint pain, aching muscles, memory loss, decreased energy, insomnia, snoring  and all other systems negative.   Epworth score 4 , Fatigue severity score 23  , depression score 2.   History   Social History  . Marital Status: Widowed    Spouse Name: N/A  . Number of Children: 2  . Years of Education: 15   Occupational History  . worked for [employer] History Main Topics  . Smoking status: Former Smoker    Quit date: 09/24/1992  . Smokeless tobacco: Never Used  . Alcohol Use: No  . Drug Use: No  . Sexual Activity: Not on file   Other Topics Concern  . Not on file   Social History Narrative   Lives alone   Left handed   caffeine use -  2 cups coffee daily, tea 2 glasses daily    Family History  Problem Relation Age of Onset  . Colon cancer Paternal Aunt   . Esophageal cancer Neg Hx   . Rectal cancer Neg Hx   . Stomach cancer Neg Hx   . Cancer Mother     ovarian  . Heart disease Brother   . Hypertension Daughter     Past Medical History  Diagnosis Date  . Hyperlipidemia   . Hypertension   . Stroke 05/2011    no paralysis  . Memory loss     Past Surgical History  Procedure Laterality Date  . Toe surgery  aprox. 1979    bilateral small toes  . Gums  2006    Current Outpatient Prescriptions  Medication Sig Dispense Refill  . aspirin 325 MG tablet Take 325 mg by mouth daily.    . hydrochlorothiazide (HYDRODIURIL) 25 MG tablet Take 25 mg by mouth daily.    Marland Kitchen losartan (COZAAR) 50 MG tablet Take 50 mg by mouth daily.   0  . Omega-3 Fatty Acids ([employer] FISH OIL) 1000 MG CAPS Take 1 capsule by mouth 1 day or 1 dose. 90 capsule 1  . simvastatin (ZOCOR) 20  MG tablet Take 20 mg by mouth every evening.     No current facility-administered medications for this visit.    Allergies as of 04/28/2015  . (No Known Allergies)    Vitals: BP 142/82 mmHg  Pulse 88  Resp 20  Ht 5\' 6"  (1.676 m)  Wt 237 lb (107.502 kg)  BMI 38.27 kg/m2 Last Weight:  Wt Readings from Last 1 Encounters:  04/28/15 237 lb (107.502 kg)  Last Height:   Ht Readings from Last 1 Encounters:  04/28/15 5\' 6"  (1.676 m)    Physical exam:  General: The patient is awake, alert and appears not in acute distress. The patient is well groomed. Head: Normocephalic, atraumatic. Neck is supple. Mallampati 4 neck circumference: 15.5 . Nasal airflow , TMJ is evident . Retrognathia is seen.  Cardiovascular:  Regular rate and rhythm, without  murmurs or carotid bruit, and without distended neck veins. Respiratory: Lungs are clear to auscultation. Skin:  Without evidence of edema, or rash Trunk: BMI is elevated and patient  has normal posture.  Neurologic exam : The patient is awake and alert, oriented to place and time.   Memory subjective  described as 'delayed" . There is a normal attention span & concentration ability. Speech is fluent without dysarthria, dysphonia or aphasia. Mood and affect are appropriate.  Cranial nerves: Pupils are equal and briskly reactive to light. Funduscopic exam without  evidence of pallor or edema.  Extraocular movements  in vertical and horizontal planes intact and without nystagmus. Visual fields by finger perimetry are intact. Hearing to finger rub intact on the right decreased on the left .  Facial sensation intact to fine touch. Facial motor strength is symmetric ,tonguemoved  Midline. Uvula not visible. shoulder d shrug symmetric, tongue protrusion is symmetric.   Motor exam:     Sensory:  Fine touch, pinprick and vibration were tested in all extremities. Proprioception is normal.  Coordination: Rapid alternating movements in the  fingers/hands is normal.  Finger-to-nose maneuver  normal without evidence of ataxia, dysmetria or tremor.  Gait and station: Patient walks without assistive device and is able unassisted to climb up to the exam table.  Strength within normal limits. Stance is stable and normal. Tandem gait is unfragmented. Romberg testing is negative.  Deep tendon reflexes: in the  upper and lower extremities are symmetric and intact. Babinski maneuver response is down going on the right and up going on the left .Marland Kitchen   Assessment:  After physical and neurologic examination, review of laboratory studies, imaging, neurophysiology testing and pre-existing records, assessment is   The patient had seen Yesenia Kramer last visit chief concern of memory loss a [place] cognitive assessment test was today performed it showed the patient having some trouble with the trail making test but she could correct her initially 1 ounce or she scored 28 out of 30 points with missing 1 point in the 5 word recall and 1. by drawing a cube. This test was reviewed with her these results were discussed with the patient addressing she has done very well there is no indication of a true memory loss. In addition she recently had an MRI of the brain which was interpreted by Yesenia Kramer and it showed white matter foci is chronic microvascular ischemic changes related to aging. These were compared to her MRI from 06-07-11 there has been minimal progression there is some mild cortical atrophy but no acute findings. She also underwent an MR angiogram at the same time which noted the vertebral arteries to be codominant. The posterior cerebral arteries also appeared normal normal. The images were reviewed  By me, Yesenia Kramer and discussed and presented to  the patient.   The patient was advised of the nature of the diagnosed sleep disorder , the treatment options and risks for general a health and wellness arising from not treating the condition. Visit duration  was 40 minutes. Over 50 % of this face to face  time was didicated to answering the patient's questions, discussing the  differential diagnosis, and treatment options should OSA / UARS be found.   Plan:  Treatment plan and additional workup :  1)I discussed with the patient some basic sleep hygiene rules, those would be going to bed before midnight if possible eliminating background screen night or light from the bedroom and if she likes to have a relaxing sound in the background she can use the CALM  application from her smart phone, or use and audio bulk or relaxing music from another source. I like for her to eliminates the TV screen and the smart phone screen from her immediate bedroom surroundings. She already prefers her bedroom cool. It didn't help to take a hot bath before going to bed.She will eliminate caffeine intake from lunch and dinner time .  2) weight loss discussion. Sugar intake is high, needs to eliminate sweetened iced tea.   3) OSA - she is snoring, her daughter witnessed it , he has not noted apnoeic spells.  She has not noticed a change in snoring since her stroke it seems not to have affected her upper airway or swallowing or her cervical cranial base . Not to have TMJ, but mild retrognathia.        Yesenia Partridge Carrera Kiesel MD  04/28/2015

## 2015-04-28 NOTE — Patient Instructions (Signed)
Sleep Apnea Sleep apnea is disorder that affects a person's sleep. A person with sleep apnea has abnormal pauses in their breathing when they sleep. It is hard for them to get a good sleep. This makes a person tired during the day. It also can lead to other physical problems. There are three types of sleep apnea. One type is when breathing stops for a short time because your airway is blocked (obstructive sleep apnea). Another type is when the brain sometimes fails to give the normal signal to breathe to the muscles that control your breathing (central sleep apnea). The third type is a combination of the other two types. HOME CARE  Do not sleep on your back. Try to sleep on your side.  Take all medicine as told by your doctor.  Avoid alcohol, calming medicines (sedatives), and depressant drugs.  Try to lose weight if you are overweight. Talk to your doctor about a healthy weight goal. Your doctor may have you use a device that helps to open your airway. It can help you get the air that you need. It is called a positive airway pressure (PAP) device. There are three types of PAP devices:  Continuous positive airway pressure (CPAP) device.  Nasal expiratory positive airway pressure (EPAP) device.  Bilevel positive airway pressure (BPAP) device. MAKE SURE YOU:  Understand these instructions.  Will watch your condition.  Will get help right away if you are not doing well or get worse. Document Released: 06/19/2008 Document Revised: 08/27/2012 Document Reviewed: 01/12/2012 [facility] Patient Information 2015 [place], [place]. This information is not intended to replace advice given to you by your health care provider. Make sure you discuss any questions you have with your health care provider.   He will have a follow-up appointment with me, Dr. Brett Fairy, after your sleep study has been completed and interpreted. Other appointments with Dr. Willaim Rayas are not affected by the appointment with me. A  carbon copy has been sent to your primary care physician, Antony Blackbird, M.D.

## 2015-05-12 ENCOUNTER — Encounter: Payer: Self-pay | Admitting: Neurology

## 2015-05-12 ENCOUNTER — Ambulatory Visit (INDEPENDENT_AMBULATORY_CARE_PROVIDER_SITE_OTHER): Payer: Medicare Other | Admitting: Neurology

## 2015-05-12 VITALS — BP 141/88 | HR 64 | Ht 66.0 in | Wt 236.4 lb

## 2015-05-12 DIAGNOSIS — I63131 Cerebral infarction due to embolism of right carotid artery: Secondary | ICD-10-CM | POA: Diagnosis not present

## 2015-05-12 DIAGNOSIS — G3184 Mild cognitive impairment, so stated: Secondary | ICD-10-CM

## 2015-05-12 NOTE — Patient Instructions (Addendum)
I had a long d/w patient and daughter about her remote TIA, risk for recurrent stroke/TIAs, personally independently reviewed imaging studies and stroke evaluation results and answered questions.Continue aspirin 325 mg orally every day  for secondary stroke prevention and maintain strict control of hypertension with blood pressure goal below 130/90, diabetes with hemoglobin A1c goal below 6.5% and lipids with LDL cholesterol goal below 100 mg/dL. I also advised the patient to eat a healthy diet with plenty of whole grains, cereals, fruits and vegetables, exercise regularly and maintain ideal body weight. She was asked to keep her upcoming appointment for polysomnogram and follow-up with Dr. Brett Fairy in case she is found to have sleep apnea. She was also advised to increase participation in mentally challenging activities like solving crossword puzzles, sudoku, playing bridge to help with the mild cognitive impairment. Followup in the future with me in 6 months or call earlier if necessary. Memory Compensation Strategies  1. Use "WARM" strategy.  W= write it down  A= associate it  R= repeat it  M= make a mental note  2.   You can keep a Social worker.  Use a 3-ring notebook with sections for the following: calendar, important names and phone numbers,  medications, doctors' names/phone numbers, lists/reminders, and a section to journal what you did  each day.   3.    Use a calendar to write appointments down.  4.    Write yourself a schedule for the day.  This can be placed on the calendar or in a separate section of the Memory Notebook.  Keeping a  regular schedule can help memory.  5.    Use medication organizer with sections for each day or morning/evening pills.  You may need help loading it  6.    Keep a basket, or pegboard by the door.  Place items that you need to take out with you in the basket or on the pegboard.  You may also want to  include a message board for reminders.  7.    Use  sticky notes.  Place sticky notes with reminders in a place where the task is performed.  For example: " turn off the  stove" placed by the stove, "lock the door" placed on the door at eye level, " take your medications" on  the bathroom mirror or by the place where you normally take your medications.  8.    Use alarms/timers.  Use while cooking to remind yourself to check on food or as a reminder to take your medicine, or as a  reminder to make a call, or as a reminder to perform another task, etc.

## 2015-05-12 NOTE — Progress Notes (Signed)
[facility] [address] [place]. [place] [address] [phone]       OFFICE FOLLOW UP VISIT NOTE  [name] Date of Birth:  [birth year] Medical Record Number:  [id number]   Referring MD: Antony Blackbird Reason for Referral:  Memory loss  HPI: 65 year pleasant lady who is accompanied by her daughter today provides most of her history. She's been having memory issues for the last few years. She misplaces objects as well as forgets her task. She occasionally stops in mid sentences and has to search for words to complete sentences. She however lives alone and is independent in activities of daily living. At times the daughter feels that her mother does not process and understand her if she is not directly facing her. She denies any headaches, and difficulty with walking, balance, behavioral issues, agitation, hallucinations or delusions. There is no family history of Alzheimer's dementia. 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 She was treated conservatively with medical therapy. She in fact participated in the IRIS study and was randomized to the placebo arm. She remains on aspirin for stroke prevention and states her blood pressure is well controlled as well as cholesterol. Blood pressure today is 132/83 today. She does complain of restless sleep and he feels tired and during the day. She does snore but there have been no witnessed sleep apnea episodes. She has not been evaluated for sleep apnea yet. Update 05/12/2015 : She returns for follow-up after last visit 2 months ago. She is doing well without recurrent stroke or TIA symptoms. She had MRI scan of the brain on 03/11/15 which I have personally reviewed and shows no acute abnormality only mild changes of chronic microvascular ischemia and generalized atrophy. MRA of the  brain and neck both do not show any high-grade intracranial or extracranial stenosis. EEG done on 6/9 for 16 was normal. Patient was seen by Dr. Roddie Mc for sleep consultation on 04/28/15 and has a polysomnogram scheduled for 05/24/15. States her blood pressure is doing well the rate slightly elevated at 141/88 in office. She is tolerating aspirin without significant bleeding or bruising. She is also on Zocor and denies significant myalgias or arthralgias. She has not been doing regular activities for mentally challenging tasks. She continues to have mild short-term memory difficulties but these are stable and not progressive ROS:   14 system review of systems is positive for  mild memory difficulties only and all other systems negative.  PMH:  Past Medical History  Diagnosis Date  . Hyperlipidemia   . Hypertension   . Stroke 05/2011    no paralysis  . Memory loss     Social History:  Social History   Social History  . Marital Status: Widowed    Spouse Name: N/A  . Number of Children: 2  . Years of Education: 15   Occupational History  . worked for [employer] History Main Topics  . Smoking status: Former Smoker    Quit date: 09/24/1992  . Smokeless tobacco: Never Used  . Alcohol Use: No  . Drug Use: No  . Sexual Activity: Not on file   Other Topics Concern  . Not on file   Social History Narrative   Lives alone   Left handed   caffeine use -  2 cups  coffee daily, tea 2 glasses daily    Medications:   Current Outpatient Prescriptions on File Prior to Visit  Medication Sig Dispense Refill  . aspirin 325 MG tablet Take 325 mg by mouth daily.    . hydrochlorothiazide (HYDRODIURIL) 25 MG tablet Take 25 mg by mouth daily.    Marland Kitchen losartan (COZAAR) 50 MG tablet Take 50 mg by mouth daily.   0  . Omega-3 Fatty Acids ([employer] FISH OIL) 1000 MG CAPS Take 1 capsule by mouth 1 day or 1 dose. 90 capsule 1  . simvastatin (ZOCOR) 20 MG tablet Take 20 mg by mouth every evening.      No current facility-administered medications on file prior to visit.    Allergies:  No Known Allergies  Physical Exam General: well developed, well nourished, seated, in no evident distress Head: head normocephalic and atraumatic.   Neck: supple with no carotid or supraclavicular bruits Cardiovascular: regular rate and rhythm, no murmurs Musculoskeletal: no deformity Skin:  no rash/petichiae Vascular:  Normal pulses all extremities  Neurologic Exam Mental Status: Awake and fully alert. Oriented to place and time. Recent and remote memory diminished. Attention span, concentration and fund of knowledge appropriate. Mood and affect appropriate. Mini-Mental status exam not done. Diminished recall 2/3. Animal naming test 10. Clock drawing 4/4.Marland Kitchen Cranial Nerves: Fundoscopic exam not done.  . Pupils equal, briskly reactive to light. Extraocular movements full without nystagmus. Visual fields full to confrontation. Hearing intact. Facial sensation intact. Face, tongue, palate moves normally and symmetrically.  Motor: Normal bulk and tone. Normal strength in all tested extremity muscles. Sensory.: intact to touch , pinprick , position and vibratory sensation.  Coordination: Rapid alternating movements normal in all extremities. Finger-to-nose and heel-to-shin performed accurately bilaterally. Gait and Station: Arises from chair without difficulty. Stance is normal. Gait demonstrates normal stride length and balance . Able to heel, toe and tandem walk without difficulty.  Reflexes: 1+ and symmetric. Toes downgoing.       ASSESSMENT: 23 year African-American lady with 2 year history of memory loss and mild cognitive difficulties likely due to mild cognitive impairment. Remote history of posterior circulation TIAs with vascular risk factors of hypertension, hyperlipidemia, extracranial vertebral artery stenosis.     PLAN: I had a long d/w patient and daughter about her remote TIA, risk for  recurrent stroke/TIAs, personally independently reviewed imaging studies and stroke evaluation results and answered questions.Continue aspirin 325 mg orally every day  for secondary stroke prevention and maintain strict control of hypertension with blood pressure goal below 130/90, diabetes with hemoglobin A1c goal below 6.5% and lipids with LDL cholesterol goal below 100 mg/dL. I also advised the patient to eat a healthy diet with plenty of whole grains, cereals, fruits and vegetables, exercise regularly and maintain ideal body weight. She was asked to keep her upcoming appointment for polysomnogram and follow-up with Dr. Brett Fairy in case she is found to have sleep apnea. She was also advised to increase participation in mentally challenging activities like solving crossword puzzles, sudoku, playing bridge to help with the mild cognitive impairment. Greater than 50% of time during this 25 minute visit was spent on counseling and coordination of care. Followup in the future with me in 6 months or call earlier if necessary.  Antony Contras, MD  Note: This document was prepared with digital dictation and possible smart phrase technology. Any transcriptional errors that result from this process are unintentional.

## 2015-05-24 ENCOUNTER — Ambulatory Visit (INDEPENDENT_AMBULATORY_CARE_PROVIDER_SITE_OTHER): Payer: Medicare Other | Admitting: Neurology

## 2015-05-24 DIAGNOSIS — G478 Other sleep disorders: Secondary | ICD-10-CM

## 2015-05-24 DIAGNOSIS — I63131 Cerebral infarction due to embolism of right carotid artery: Secondary | ICD-10-CM

## 2015-05-24 DIAGNOSIS — G3184 Mild cognitive impairment, so stated: Secondary | ICD-10-CM

## 2015-05-24 DIAGNOSIS — R0683 Snoring: Secondary | ICD-10-CM

## 2015-05-24 NOTE — Sleep Study (Signed)
Please see the scanned sleep study interpretation located in the Procedure tab within the Chart Review section. 

## 2015-05-26 ENCOUNTER — Telehealth: Payer: Self-pay

## 2015-05-26 NOTE — Telephone Encounter (Signed)
Spoke to pt regarding her sleep study results. Advised her that no organic sleep disorder was found in her study. Advised her that Dr. Brett Fairy recommended weight loss, diet, and exercise, and to not drive while sleepy. Advised her that her arms and legs moved during sleep but only after she had woken up. Dr. Brett Fairy recommended following up with Dr. Leonie Man and her PCP Dr. Chapman Fitch, and another appt in the sleep clinic was not needed. Pt verbalized understanding. Dr. Chapman Fitch was faxed a copy of the pt's sleep study per pt request.

## 2015-06-01 ENCOUNTER — Other Ambulatory Visit: Payer: Self-pay | Admitting: Family Medicine

## 2015-06-01 DIAGNOSIS — E049 Nontoxic goiter, unspecified: Secondary | ICD-10-CM | POA: Diagnosis not present

## 2015-06-01 DIAGNOSIS — R7309 Other abnormal glucose: Secondary | ICD-10-CM | POA: Diagnosis not present

## 2015-06-01 DIAGNOSIS — E785 Hyperlipidemia, unspecified: Secondary | ICD-10-CM | POA: Diagnosis not present

## 2015-06-01 DIAGNOSIS — I6529 Occlusion and stenosis of unspecified carotid artery: Secondary | ICD-10-CM | POA: Diagnosis not present

## 2015-06-01 DIAGNOSIS — E041 Nontoxic single thyroid nodule: Secondary | ICD-10-CM

## 2015-06-01 DIAGNOSIS — R8299 Other abnormal findings in urine: Secondary | ICD-10-CM | POA: Diagnosis not present

## 2015-06-01 DIAGNOSIS — Z79899 Other long term (current) drug therapy: Secondary | ICD-10-CM | POA: Diagnosis not present

## 2015-06-01 DIAGNOSIS — Z23 Encounter for immunization: Secondary | ICD-10-CM | POA: Diagnosis not present

## 2015-06-01 DIAGNOSIS — I10 Essential (primary) hypertension: Secondary | ICD-10-CM | POA: Diagnosis not present

## 2015-06-01 DIAGNOSIS — I6931 Cognitive deficits following cerebral infarction: Secondary | ICD-10-CM | POA: Diagnosis not present

## 2015-06-06 ENCOUNTER — Other Ambulatory Visit: Payer: Self-pay | Admitting: Family Medicine

## 2015-06-06 DIAGNOSIS — Z8673 Personal history of transient ischemic attack (TIA), and cerebral infarction without residual deficits: Secondary | ICD-10-CM

## 2015-06-08 ENCOUNTER — Other Ambulatory Visit: Payer: Self-pay | Admitting: Family Medicine

## 2015-06-08 DIAGNOSIS — Z1231 Encounter for screening mammogram for malignant neoplasm of breast: Secondary | ICD-10-CM

## 2015-06-15 ENCOUNTER — Ambulatory Visit
Admission: RE | Admit: 2015-06-15 | Discharge: 2015-06-15 | Disposition: A | Discharge status: Home / Self Care | Payer: Medicare Other | Source: Ambulatory Visit | Attending: Family Medicine | Admitting: Family Medicine

## 2015-06-15 DIAGNOSIS — Z1231 Encounter for screening mammogram for malignant neoplasm of breast: Secondary | ICD-10-CM | POA: Diagnosis not present

## 2015-06-16 ENCOUNTER — Other Ambulatory Visit: Payer: Self-pay | Admitting: Family Medicine

## 2015-06-16 DIAGNOSIS — R928 Other abnormal and inconclusive findings on diagnostic imaging of breast: Secondary | ICD-10-CM

## 2015-06-22 ENCOUNTER — Ambulatory Visit
Admission: RE | Admit: 2015-06-22 | Discharge: 2015-06-22 | Disposition: A | Discharge status: Home / Self Care | Payer: Medicare Other | Source: Ambulatory Visit | Attending: Family Medicine | Admitting: Family Medicine

## 2015-06-22 DIAGNOSIS — R928 Other abnormal and inconclusive findings on diagnostic imaging of breast: Secondary | ICD-10-CM

## 2015-07-04 ENCOUNTER — Ambulatory Visit
Admission: RE | Admit: 2015-07-04 | Discharge: 2015-07-04 | Disposition: A | Discharge status: Home / Self Care | Payer: Medicare Other | Source: Ambulatory Visit | Attending: Family Medicine | Admitting: Family Medicine

## 2015-07-04 DIAGNOSIS — N63 Unspecified lump in breast: Secondary | ICD-10-CM | POA: Diagnosis not present

## 2015-11-22 ENCOUNTER — Ambulatory Visit: Payer: Medicare Other | Admitting: Neurology

## 2015-12-19 DIAGNOSIS — H43812 Vitreous degeneration, left eye: Secondary | ICD-10-CM | POA: Diagnosis not present

## 2015-12-19 DIAGNOSIS — H538 Other visual disturbances: Secondary | ICD-10-CM | POA: Diagnosis not present

## 2015-12-19 DIAGNOSIS — H2513 Age-related nuclear cataract, bilateral: Secondary | ICD-10-CM | POA: Diagnosis not present

## 2016-01-19 ENCOUNTER — Encounter: Payer: Self-pay | Admitting: Neurology

## 2016-01-19 ENCOUNTER — Ambulatory Visit (INDEPENDENT_AMBULATORY_CARE_PROVIDER_SITE_OTHER): Payer: Medicare Other | Admitting: Neurology

## 2016-01-19 VITALS — BP 127/87 | HR 86 | Ht 66.0 in | Wt 225.4 lb

## 2016-01-19 DIAGNOSIS — I6529 Occlusion and stenosis of unspecified carotid artery: Secondary | ICD-10-CM | POA: Diagnosis not present

## 2016-01-19 NOTE — Progress Notes (Signed)
[facility] [address] [place]. [place] [address] [phone]       OFFICE FOLLOW UP VISIT NOTE  [name] Date of Birth:  03/17/1941 Medical Record Number:  [id number]   Referring MD: Antony Blackbird Reason for Referral:  Memory loss  HPI: 69 year pleasant lady who is accompanied by her daughter today provides most of her history. She's been having memory issues for the last few years. She misplaces objects as well as forgets her task. She occasionally stops in mid sentences and has to search for words to complete sentences. She however lives alone and is independent in activities of daily living. At times the daughter feels that her mother does not process and understand her if she is not directly facing her. She denies any headaches, and difficulty with walking, balance, behavioral issues, agitation, hallucinations or delusions. There is no family history of Alzheimer's dementia. 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 She was treated conservatively with medical therapy. She in fact participated in the IRIS study and was randomized to the placebo arm. She remains on aspirin for stroke prevention and states her blood pressure is well controlled as well as cholesterol. Blood pressure today is 132/83 today. She does complain of restless sleep and he feels tired and during the day. She does snore but there have been no witnessed sleep apnea episodes. She has not been evaluated for sleep apnea yet. Update 05/12/2015 : She returns for follow-up after last visit 2 months ago. She is doing well without recurrent stroke or TIA symptoms. She had MRI scan of the brain on 03/11/15 which I have personally reviewed and shows no acute abnormality only mild changes of chronic microvascular ischemia and generalized atrophy. MRA of the  brain and neck both do not show any high-grade intracranial or extracranial stenosis. EEG done on 6/9 16 was normal. Patient was seen by Dr. Roddie Mc for sleep consultation on 04/28/15 and has a polysomnogram scheduled for 05/24/15. States her blood pressure is doing well the rate slightly elevated at 141/88 in office. She is tolerating aspirin without significant bleeding or bruising. She is also on Zocor and denies significant myalgias or arthralgias. She has not been doing regular activities for mentally challenging tasks. She continues to have mild short-term memory difficulties but these are stable and not progressive Update 01/19/2016 : She returns for follow-up after last visit 8 months ago. She is accompanied by her daughter. She states she'll doing well she'll had no recurrent stroke or TIA symptoms. She is tolerating aspirin well without bleeding or bruising. She states her blood pressure is well controlled and today it is 127/87. She remains on Zocor which is tolerating well without myalgias or arthralgias. She has not had carotid ultrasounds done for more than a year. She has not been particularly careful with her diet and has not lost any weight. ROS:   14 system review of systems is positive for  abdominal pain only and all other systems negative.  PMH:  Past Medical History  Diagnosis Date  . Hyperlipidemia   . Hypertension   . Stroke ([place]) 05/2011    no paralysis  . Memory loss     Social History:  Social History   Social History  . Marital Status: Widowed    Spouse Name: N/A  .  Number of Children: 2  . Years of Education: 15   Occupational History  . worked for [employer] History Main Topics  . Smoking status: Former Smoker    Quit date: 09/24/1992  . Smokeless tobacco: Never Used  . Alcohol Use: No  . Drug Use: No  . Sexual Activity: Not on file   Other Topics Concern  . Not on file   Social History Narrative   Lives alone   Left handed   caffeine use -   2 cups coffee daily, tea 2 glasses daily    Medications:   Current Outpatient Prescriptions on File Prior to Visit  Medication Sig Dispense Refill  . aspirin 325 MG tablet Take 325 mg by mouth daily.    . hydrochlorothiazide (HYDRODIURIL) 25 MG tablet Take 25 mg by mouth daily.    Marland Kitchen losartan (COZAAR) 50 MG tablet Take 50 mg by mouth daily.   0  . Omega-3 Fatty Acids ([employer] FISH OIL) 1000 MG CAPS Take 1 capsule by mouth 1 day or 1 dose. 90 capsule 1  . simvastatin (ZOCOR) 20 MG tablet Take 20 mg by mouth every evening.     No current facility-administered medications on file prior to visit.    Allergies:  No Known Allergies  Physical Exam General: well developed, well nourished elderly african american lady, seated, in no evident distress Head: head normocephalic and atraumatic.   Neck: supple with no carotid or supraclavicular bruits Cardiovascular: regular rate and rhythm, no murmurs Musculoskeletal: no deformity Skin:  no rash/petichiae Vascular:  Normal pulses all extremities  Neurologic Exam Mental Status: Awake and fully alert. Oriented to place and time. Recent and remote memory diminished. Attention span, concentration and fund of knowledge appropriate. Mood and affect appropriate. Mini-Mental status exam not done. Diminished recall 2/3. Animal naming test 10. Clock drawing 4/4.Marland Kitchen Cranial Nerves: Fundoscopic exam not done.  . Pupils equal, briskly reactive to light. Extraocular movements full without nystagmus. Visual fields full to confrontation. Hearing intact. Facial sensation intact. Face, tongue, palate moves normally and symmetrically.  Motor: Normal bulk and tone. Normal strength in all tested extremity muscles. Sensory.: intact to touch , pinprick , position and vibratory sensation.  Coordination: Rapid alternating movements normal in all extremities. Finger-to-nose and heel-to-shin performed accurately bilaterally. Gait and Station: Arises from chair without difficulty.  Stance is normal. Gait demonstrates normal stride length and balance . Able to heel, toe and tandem walk with slight difficulty.  Reflexes: 1+ and symmetric. Toes downgoing.       ASSESSMENT: 70 year African-American lady with 2 year history of memory loss and mild cognitive difficulties likely due to mild cognitive impairment. Remote history of posterior circulation TIAs with vascular risk factors of hypertension, hyperlipidemia, extracranial vertebral artery stenosis.     PLAN: I had a long d/w patient and her daughter about her remote stroke, stroke, risk for recurrent stroke/TIAs, personally independently reviewed imaging studies and stroke evaluation results and answered questions.Continue aspirin 325 mg daily  for secondary stroke prevention and maintain strict control of hypertension with blood pressure goal below 130/90, diabetes with hemoglobin A1c goal below 6.5% and lipids with LDL cholesterol goal below 70 mg/dL. I also advised the patient to eat a healthy diet with plenty of whole grains, cereals, fruits and vegetables, exercise regularly and maintain ideal body weight. Check follow-up carotid ultrasound study as she has not had it in a couple of years. Greater than 50% time during this 20 family to  visit was spent on counseling and coordination of care about her stroke and TIA risk Followup in the future with stroke NP in  1 year or call earlier if necessary   Antony Contras, MD  Note: This document was prepared with digital dictation and possible smart phrase technology. Any transcriptional errors that result from this process are unintentional.

## 2016-01-19 NOTE — Patient Instructions (Addendum)
I had a long d/w patient about her remote stroke, stroke, risk for recurrent stroke/TIAs, personally independently reviewed imaging studies and stroke evaluation results and answered questions.Continue aspirin 325 mg daily  for secondary stroke prevention and maintain strict control of hypertension with blood pressure goal below 130/90, diabetes with hemoglobin A1c goal below 6.5% and lipids with LDL cholesterol goal below 70 mg/dL. I also advised the patient to eat a healthy diet with plenty of whole grains, cereals, fruits and vegetables, exercise regularly and maintain ideal body weight. Check follow-up carotid ultrasound study as she has not had it in a couple of years. Followup in the future with stroke NP in  1 year or call earlier if necessary

## 2016-02-01 ENCOUNTER — Ambulatory Visit (INDEPENDENT_AMBULATORY_CARE_PROVIDER_SITE_OTHER): Payer: Medicare Other

## 2016-02-01 DIAGNOSIS — I6529 Occlusion and stenosis of unspecified carotid artery: Secondary | ICD-10-CM

## 2016-02-01 DIAGNOSIS — R0989 Other specified symptoms and signs involving the circulatory and respiratory systems: Secondary | ICD-10-CM | POA: Diagnosis not present

## 2016-02-07 ENCOUNTER — Telehealth: Payer: Self-pay | Admitting: *Deleted

## 2016-02-07 NOTE — Telephone Encounter (Signed)
Yesenia Kramer, Yesenia Kramer               CID [id number]  Patient SAME                 Pt's Dr Leonie Man        Area Code 336 Phone# [phone] * DOB [birth year] [address] OF [employer] FROM [birth year]                                                            Disp:Y/N N If Y = C/B If No Response In 58minutes ============================================================

## 2016-02-08 NOTE — Telephone Encounter (Signed)
Rn call patient to let her know that Dr .Leonie Man will be in the office on Monday. Rn stated he can review her results and give it to her on Monday. Pt verbalized understanding.

## 2016-02-15 NOTE — Telephone Encounter (Signed)
Rn call patient about her US carotid test. Rn stated per Dr. Leonie Man the US carotid test was normal. Pt verbalized understanding.

## 2016-02-17 NOTE — Telephone Encounter (Signed)
Kindly inform the patient that carotid ultrasound study was unremarkable

## 2016-05-30 DIAGNOSIS — R591 Generalized enlarged lymph nodes: Secondary | ICD-10-CM | POA: Diagnosis not present

## 2016-05-30 DIAGNOSIS — Z23 Encounter for immunization: Secondary | ICD-10-CM | POA: Diagnosis not present

## 2016-06-04 DIAGNOSIS — R591 Generalized enlarged lymph nodes: Secondary | ICD-10-CM | POA: Diagnosis not present

## 2016-06-11 DIAGNOSIS — R591 Generalized enlarged lymph nodes: Secondary | ICD-10-CM | POA: Diagnosis not present

## 2016-12-18 DIAGNOSIS — H43812 Vitreous degeneration, left eye: Secondary | ICD-10-CM | POA: Diagnosis not present

## 2016-12-18 DIAGNOSIS — H2513 Age-related nuclear cataract, bilateral: Secondary | ICD-10-CM | POA: Diagnosis not present

## 2016-12-18 DIAGNOSIS — H538 Other visual disturbances: Secondary | ICD-10-CM | POA: Diagnosis not present

## 2017-01-17 ENCOUNTER — Encounter: Payer: Self-pay | Admitting: Neurology

## 2017-01-17 ENCOUNTER — Ambulatory Visit (INDEPENDENT_AMBULATORY_CARE_PROVIDER_SITE_OTHER): Payer: Medicare Other | Admitting: Neurology

## 2017-01-17 VITALS — BP 147/82 | HR 70 | Wt 238.0 lb

## 2017-01-17 DIAGNOSIS — G3184 Mild cognitive impairment, so stated: Secondary | ICD-10-CM | POA: Diagnosis not present

## 2017-01-17 NOTE — Patient Instructions (Signed)
I had a long d/w patient about her remote TIAs, mild memory loss, risk for recurrent stroke/TIAs, personally independently reviewed imaging studies and stroke evaluation results and answered questions.Continue aspirin 325 mg daily  for secondary stroke prevention and maintain strict control of hypertension with blood pressure goal below 130/90, diabetes with hemoglobin A1c goal below 6.5% and lipids with LDL cholesterol goal below 70 mg/dL. the patient has stopped her Glucophage and Zocor and I counseled her to be compliant and to make an appointment to see her primary care physician to have lab work checked to see if she needs those medicines. I also advised the patient to eat a healthy diet with plenty of whole grains, cereals, fruits and vegetables, exercise regularly and maintain ideal body weight. She will stay on resume trial for her memory loss as well as part is present in cognitively challenging activities. We also talked about memory compensation strategies. No routine scheduled follow-up visit with me is necessary in the future but she may be referred back by primary physician if needed Memory Compensation Strategies  1. Use "WARM" strategy.  W= write it down  A= associate it  R= repeat it  M= make a mental note  2.   You can keep a Social worker.  Use a 3-ring notebook with sections for the following: calendar, important names and phone numbers,  medications, doctors' names/phone numbers, lists/reminders, and a section to journal what you did  each day.   3.    Use a calendar to write appointments down.  4.    Write yourself a schedule for the day.  This can be placed on the calendar or in a separate section of the Memory Notebook.  Keeping a  regular schedule can help memory.  5.    Use medication organizer with sections for each day or morning/evening pills.  You may need help loading it  6.    Keep a basket, or pegboard by the door.  Place items that you need to take out with  you in the basket or on the pegboard.  You may also want to  include a message board for reminders.  7.    Use sticky notes.  Place sticky notes with reminders in a place where the task is performed.  For example: " turn off the  stove" placed by the stove, "lock the door" placed on the door at eye level, " take your medications" on  the bathroom mirror or by the place where you normally take your medications.  8.    Use alarms/timers.  Use while cooking to remind yourself to check on food or as a reminder to take your medicine, or as a  reminder to make a call, or as a reminder to perform another task, etc.

## 2017-01-17 NOTE — Progress Notes (Signed)
[facility] [address] [place]. [place] [address] [phone]       OFFICE FOLLOW UP VISIT NOTE  Yesenia Kramer Date of Birth:  December 13, 1940 Medical Record Number:  [id number]   Referring MD: Antony Blackbird Reason for Referral:  Memory loss  HPI: 37 year pleasant lady who is accompanied by her daughter today provides most of her history. She's been having memory issues for the last few years. She misplaces objects as well as forgets her task. She occasionally stops in mid sentences and has to search for words to complete sentences. She however lives alone and is independent in activities of daily living. At times the daughter feels that her mother does not process and understand her if she is not directly facing her. She denies any headaches, and difficulty with walking, balance, behavioral issues, agitation, hallucinations or delusions. There is no family history of Alzheimer's dementia. 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 She was treated conservatively with medical therapy. She in fact participated in the IRIS study and was randomized to the placebo arm. She remains on aspirin for stroke prevention and states her blood pressure is well controlled as well as cholesterol. Blood pressure today is 132/83 today. She does complain of restless sleep and he feels tired and during the day. She does snore but there have been no witnessed sleep apnea episodes. She has not been evaluated for sleep apnea yet. Update 05/12/2015 : She returns for follow-up after last visit 2 months ago. She is doing well without recurrent stroke or TIA symptoms. She had MRI scan of the brain on 03/11/15 which I have personally reviewed and shows no acute abnormality only mild changes of chronic microvascular ischemia and generalized atrophy. MRA of the  brain and neck both do not show any high-grade intracranial or extracranial stenosis. EEG done on 6/9 16 was normal. Patient was seen by Dr. Roddie Mc for sleep consultation on 04/28/15 and has a polysomnogram scheduled for 05/24/15. States her blood pressure is doing well the rate slightly elevated at 141/88 in office. She is tolerating aspirin without significant bleeding or bruising. She is also on Zocor and denies significant myalgias or arthralgias. She has not been doing regular activities for mentally challenging tasks. She continues to have mild short-term memory difficulties but these are stable and not progressive Update 01/19/2016 : She returns for follow-up after last visit 8 months ago. She is accompanied by her daughter. She states she'll doing well she'll had no recurrent stroke or TIA symptoms. She is tolerating aspirin well without bleeding or bruising. She states her blood pressure is well controlled and today it is 127/87. She remains on Zocor which is tolerating well without myalgias or arthralgias. She has not had carotid ultrasounds done for more than a year. She has not been particularly careful with her diet and has not lost any weight. Update 01/17/2017 : She returns for follow-up after last with a year ago. She is accompanied by her daughter. She continues to have short-term memory difficulties which are unchanged. She cannot remember at times people's names. She is a couple of times called her daughter while driving asking for directions. She however remains living independently at home. She denies any word finding difficulties and trouble completing sentences. She states she has stopped taking Glucophage as well as  Zocor because she thought she did not need it. She has not seen a primary care physician for a while. Her blood pressure is usually well controlled though today it is slightly high at 147/86. She is starting aspirin well without bruising or bleeding. She has not lost any weight and  she is not very active. She has no new complaints today ROS:   14 system review of systems is positive for  memory loss only only and all other systems negative.  PMH:  Past Medical History:  Diagnosis Date  . Hyperlipidemia   . Hypertension   . Memory loss   . Stroke [facility]) 05/2011   no paralysis    Social History:  Social History   Social History  . Marital status: Widowed    Spouse name: N/A  . Number of children: 2  . Years of education: 15   Occupational History  . worked for [employer] History Main Topics  . Smoking status: Former Smoker    Quit date: 09/24/1992  . Smokeless tobacco: Never Used  . Alcohol use No  . Drug use: No  . Sexual activity: Not on file   Other Topics Concern  . Not on file   Social History Narrative   Lives alone   Left handed   caffeine use -  2 cups coffee daily, tea 2 glasses daily    Medications:   Current Outpatient Prescriptions on File Prior to Visit  Medication Sig Dispense Refill  . aspirin 325 MG tablet Take 325 mg by mouth daily.    . hydrochlorothiazide (HYDRODIURIL) 25 MG tablet Take 25 mg by mouth daily.    Marland Kitchen losartan (COZAAR) 50 MG tablet Take 50 mg by mouth daily.   0   No current facility-administered medications on file prior to visit.     Allergies:  No Known Allergies  Physical Exam General: well developed, well nourished elderly african american lady, seated, in no evident distress Head: head normocephalic and atraumatic.   Neck: supple with no carotid or supraclavicular bruits Cardiovascular: regular rate and rhythm, no murmurs Musculoskeletal: no deformity Skin:  no rash/petichiae Vascular:  Normal pulses all extremities  Neurologic Exam Mental Status: Awake and fully alert. Oriented to place and time. Recent and remote memory diminished. Attention span, concentration and fund of knowledge appropriate. Mood and affect appropriate. Mini-Mental status exam not done. Diminished recall 2/3. Animal  naming test 6 Clock drawing 4/4.Marland Kitchen Cranial Nerves: Fundoscopic exam not done.  . Pupils equal, briskly reactive to light. Extraocular movements full without nystagmus. Visual fields full to confrontation. Hearing intact. Facial sensation intact. Face, tongue, palate moves normally and symmetrically.  Motor: Normal bulk and tone. Normal strength in all tested extremity muscles. Sensory.: intact to touch , pinprick , position and vibratory sensation.  Coordination: Rapid alternating movements normal in all extremities. Finger-to-nose and heel-to-shin performed accurately bilaterally. Gait and Station: Arises from chair without difficulty. Stance is normal. Gait demonstrates normal stride length and balance . Able to heel, toe and tandem walk with slight difficulty.  Reflexes: 1+ and symmetric. Toes downgoing.       ASSESSMENT: 72 year African-American lady with 2 year history of memory loss and mild cognitive difficulties likely due to mild cognitive impairment. Remote history of posterior circulation TIAs with vascular risk factors of hypertension, hyperlipidemia, extracranial vertebral artery stenosis.     PLAN: I had a long d/w patient about her remote TIAs, mild memory loss, risk for recurrent stroke/TIAs, personally  independently reviewed imaging studies and stroke evaluation results and answered questions.Continue aspirin 325 mg daily  for secondary stroke prevention and maintain strict control of hypertension with blood pressure goal below 130/90, diabetes with hemoglobin A1c goal below 6.5% and lipids with LDL cholesterol goal below 70 mg/dL. the patient has stopped her Glucophage and Zocor and I counseled her to be compliant and to make an appointment to see her primary care physician to have lab work checked to see if she needs those medicines. I also advised the patient to eat a healthy diet with plenty of whole grains, cereals, fruits and vegetables, exercise regularly and maintain ideal  body weight. She will stay on resume trial for her memory loss as well as part is present in cognitively challenging activities. We also talked about memory compensation strategies. Greater than 50% time during this 25 minute visit was spent on counseling and coordination of care about her remote TIA and mild cognitive impairment No routine scheduled follow-up visit with me is necessary in the future but she may be referred back by primary physician if needed   Antony Contras, MD  Note: This document was prepared with digital dictation and possible smart phrase technology. Any transcriptional errors that result from this process are unintentional.

## 2017-01-24 DIAGNOSIS — E669 Obesity, unspecified: Secondary | ICD-10-CM | POA: Diagnosis not present

## 2017-01-24 DIAGNOSIS — I10 Essential (primary) hypertension: Secondary | ICD-10-CM | POA: Diagnosis not present

## 2017-01-24 DIAGNOSIS — E559 Vitamin D deficiency, unspecified: Secondary | ICD-10-CM | POA: Diagnosis not present

## 2017-01-24 DIAGNOSIS — E785 Hyperlipidemia, unspecified: Secondary | ICD-10-CM | POA: Diagnosis not present

## 2017-01-24 DIAGNOSIS — Z8673 Personal history of transient ischemic attack (TIA), and cerebral infarction without residual deficits: Secondary | ICD-10-CM | POA: Diagnosis not present

## 2017-01-24 DIAGNOSIS — R5383 Other fatigue: Secondary | ICD-10-CM | POA: Diagnosis not present

## 2017-01-24 DIAGNOSIS — Z6838 Body mass index (BMI) 38.0-38.9, adult: Secondary | ICD-10-CM | POA: Diagnosis not present

## 2017-01-29 DIAGNOSIS — E559 Vitamin D deficiency, unspecified: Secondary | ICD-10-CM | POA: Diagnosis not present

## 2017-01-29 DIAGNOSIS — R7309 Other abnormal glucose: Secondary | ICD-10-CM | POA: Diagnosis not present

## 2017-01-29 DIAGNOSIS — R5383 Other fatigue: Secondary | ICD-10-CM | POA: Diagnosis not present

## 2017-01-29 DIAGNOSIS — E785 Hyperlipidemia, unspecified: Secondary | ICD-10-CM | POA: Diagnosis not present

## 2017-02-20 DIAGNOSIS — Z6838 Body mass index (BMI) 38.0-38.9, adult: Secondary | ICD-10-CM | POA: Diagnosis not present

## 2017-02-20 DIAGNOSIS — R7303 Prediabetes: Secondary | ICD-10-CM | POA: Diagnosis not present

## 2017-02-20 DIAGNOSIS — I10 Essential (primary) hypertension: Secondary | ICD-10-CM | POA: Diagnosis not present

## 2017-08-14 DIAGNOSIS — Z0000 Encounter for general adult medical examination without abnormal findings: Secondary | ICD-10-CM | POA: Diagnosis not present

## 2017-08-14 DIAGNOSIS — E785 Hyperlipidemia, unspecified: Secondary | ICD-10-CM | POA: Diagnosis not present

## 2017-08-14 DIAGNOSIS — R7301 Impaired fasting glucose: Secondary | ICD-10-CM | POA: Diagnosis not present

## 2017-08-14 DIAGNOSIS — R5383 Other fatigue: Secondary | ICD-10-CM | POA: Diagnosis not present

## 2017-08-26 DIAGNOSIS — E782 Mixed hyperlipidemia: Secondary | ICD-10-CM | POA: Diagnosis not present

## 2017-08-26 DIAGNOSIS — E119 Type 2 diabetes mellitus without complications: Secondary | ICD-10-CM | POA: Diagnosis not present

## 2017-08-26 DIAGNOSIS — I10 Essential (primary) hypertension: Secondary | ICD-10-CM | POA: Diagnosis not present

## 2017-08-26 DIAGNOSIS — Z23 Encounter for immunization: Secondary | ICD-10-CM | POA: Diagnosis not present

## 2018-10-01 DIAGNOSIS — R928 Other abnormal and inconclusive findings on diagnostic imaging of breast: Secondary | ICD-10-CM | POA: Diagnosis not present

## 2018-10-01 DIAGNOSIS — Z853 Personal history of malignant neoplasm of breast: Secondary | ICD-10-CM | POA: Diagnosis not present

## 2018-10-01 DIAGNOSIS — Z9889 Other specified postprocedural states: Secondary | ICD-10-CM | POA: Diagnosis not present

## 2018-10-06 DIAGNOSIS — Z79811 Long term (current) use of aromatase inhibitors: Secondary | ICD-10-CM | POA: Diagnosis not present

## 2018-10-06 DIAGNOSIS — Z170 Estrogen receptor positive status [ER+]: Secondary | ICD-10-CM | POA: Diagnosis not present

## 2018-10-06 DIAGNOSIS — C50412 Malignant neoplasm of upper-outer quadrant of left female breast: Secondary | ICD-10-CM | POA: Diagnosis not present

## 2018-11-27 DIAGNOSIS — E785 Hyperlipidemia, unspecified: Secondary | ICD-10-CM | POA: Diagnosis not present

## 2018-11-27 DIAGNOSIS — E118 Type 2 diabetes mellitus with unspecified complications: Secondary | ICD-10-CM | POA: Diagnosis not present

## 2018-12-01 DIAGNOSIS — I10 Essential (primary) hypertension: Secondary | ICD-10-CM | POA: Diagnosis not present

## 2018-12-01 DIAGNOSIS — M79601 Pain in right arm: Secondary | ICD-10-CM | POA: Diagnosis not present

## 2018-12-01 DIAGNOSIS — E669 Obesity, unspecified: Secondary | ICD-10-CM | POA: Diagnosis not present

## 2018-12-01 DIAGNOSIS — E1169 Type 2 diabetes mellitus with other specified complication: Secondary | ICD-10-CM | POA: Diagnosis not present

## 2018-12-01 DIAGNOSIS — M25511 Pain in right shoulder: Secondary | ICD-10-CM | POA: Diagnosis not present

## 2018-12-01 DIAGNOSIS — M79621 Pain in right upper arm: Secondary | ICD-10-CM | POA: Diagnosis not present

## 2019-03-26 DIAGNOSIS — L821 Other seborrheic keratosis: Secondary | ICD-10-CM | POA: Diagnosis not present

## 2019-04-06 DIAGNOSIS — Z170 Estrogen receptor positive status [ER+]: Secondary | ICD-10-CM | POA: Diagnosis not present

## 2019-04-06 DIAGNOSIS — C50412 Malignant neoplasm of upper-outer quadrant of left female breast: Secondary | ICD-10-CM | POA: Diagnosis not present

## 2019-06-04 DIAGNOSIS — E669 Obesity, unspecified: Secondary | ICD-10-CM | POA: Diagnosis not present

## 2019-06-04 DIAGNOSIS — Z87891 Personal history of nicotine dependence: Secondary | ICD-10-CM | POA: Diagnosis not present

## 2019-06-04 DIAGNOSIS — I10 Essential (primary) hypertension: Secondary | ICD-10-CM | POA: Diagnosis not present

## 2019-06-04 DIAGNOSIS — E7800 Pure hypercholesterolemia, unspecified: Secondary | ICD-10-CM | POA: Diagnosis not present

## 2019-06-04 DIAGNOSIS — E1169 Type 2 diabetes mellitus with other specified complication: Secondary | ICD-10-CM | POA: Diagnosis not present

## 2019-06-04 DIAGNOSIS — Z6835 Body mass index (BMI) 35.0-35.9, adult: Secondary | ICD-10-CM | POA: Diagnosis not present

## 2019-06-04 DIAGNOSIS — Z7984 Long term (current) use of oral hypoglycemic drugs: Secondary | ICD-10-CM | POA: Diagnosis not present

## 2019-06-11 DIAGNOSIS — E1169 Type 2 diabetes mellitus with other specified complication: Secondary | ICD-10-CM | POA: Diagnosis not present

## 2019-06-11 DIAGNOSIS — E669 Obesity, unspecified: Secondary | ICD-10-CM | POA: Diagnosis not present

## 2019-06-11 DIAGNOSIS — E7800 Pure hypercholesterolemia, unspecified: Secondary | ICD-10-CM | POA: Diagnosis not present

## 2019-06-11 DIAGNOSIS — D649 Anemia, unspecified: Secondary | ICD-10-CM | POA: Diagnosis not present

## 2019-10-05 DIAGNOSIS — C50919 Malignant neoplasm of unspecified site of unspecified female breast: Secondary | ICD-10-CM | POA: Diagnosis not present

## 2019-10-05 DIAGNOSIS — R928 Other abnormal and inconclusive findings on diagnostic imaging of breast: Secondary | ICD-10-CM | POA: Diagnosis not present

## 2019-10-05 DIAGNOSIS — C50412 Malignant neoplasm of upper-outer quadrant of left female breast: Secondary | ICD-10-CM | POA: Diagnosis not present

## 2019-10-05 DIAGNOSIS — Z170 Estrogen receptor positive status [ER+]: Secondary | ICD-10-CM | POA: Diagnosis not present

## 2019-10-05 DIAGNOSIS — Z1231 Encounter for screening mammogram for malignant neoplasm of breast: Secondary | ICD-10-CM | POA: Diagnosis not present

## 2019-10-08 DIAGNOSIS — Z87891 Personal history of nicotine dependence: Secondary | ICD-10-CM | POA: Diagnosis not present

## 2019-10-08 DIAGNOSIS — D649 Anemia, unspecified: Secondary | ICD-10-CM | POA: Diagnosis not present

## 2019-10-08 DIAGNOSIS — Z79811 Long term (current) use of aromatase inhibitors: Secondary | ICD-10-CM | POA: Diagnosis not present

## 2019-10-08 DIAGNOSIS — C50412 Malignant neoplasm of upper-outer quadrant of left female breast: Secondary | ICD-10-CM | POA: Diagnosis not present

## 2019-10-08 DIAGNOSIS — Z853 Personal history of malignant neoplasm of breast: Secondary | ICD-10-CM | POA: Diagnosis not present

## 2019-10-08 DIAGNOSIS — Z170 Estrogen receptor positive status [ER+]: Secondary | ICD-10-CM | POA: Diagnosis not present

## 2019-10-21 DIAGNOSIS — D649 Anemia, unspecified: Secondary | ICD-10-CM | POA: Diagnosis not present

## 2019-10-28 DIAGNOSIS — D649 Anemia, unspecified: Secondary | ICD-10-CM | POA: Diagnosis not present

## 2019-12-10 DIAGNOSIS — E7849 Other hyperlipidemia: Secondary | ICD-10-CM | POA: Diagnosis not present

## 2019-12-10 DIAGNOSIS — E1169 Type 2 diabetes mellitus with other specified complication: Secondary | ICD-10-CM | POA: Diagnosis not present

## 2019-12-10 DIAGNOSIS — E669 Obesity, unspecified: Secondary | ICD-10-CM | POA: Diagnosis not present

## 2019-12-10 DIAGNOSIS — L80 Vitiligo: Secondary | ICD-10-CM | POA: Diagnosis not present

## 2019-12-10 DIAGNOSIS — I10 Essential (primary) hypertension: Secondary | ICD-10-CM | POA: Diagnosis not present

## 2019-12-17 DIAGNOSIS — E1169 Type 2 diabetes mellitus with other specified complication: Secondary | ICD-10-CM | POA: Diagnosis not present

## 2019-12-17 DIAGNOSIS — E669 Obesity, unspecified: Secondary | ICD-10-CM | POA: Diagnosis not present

## 2019-12-17 DIAGNOSIS — E7849 Other hyperlipidemia: Secondary | ICD-10-CM | POA: Diagnosis not present

## 2020-06-06 DIAGNOSIS — E669 Obesity, unspecified: Secondary | ICD-10-CM | POA: Diagnosis not present

## 2020-06-06 DIAGNOSIS — Z13220 Encounter for screening for lipoid disorders: Secondary | ICD-10-CM | POA: Diagnosis not present

## 2020-06-06 DIAGNOSIS — E1169 Type 2 diabetes mellitus with other specified complication: Secondary | ICD-10-CM | POA: Diagnosis not present

## 2020-06-06 DIAGNOSIS — Z1329 Encounter for screening for other suspected endocrine disorder: Secondary | ICD-10-CM | POA: Diagnosis not present

## 2020-06-06 DIAGNOSIS — E785 Hyperlipidemia, unspecified: Secondary | ICD-10-CM | POA: Diagnosis not present

## 2020-06-06 DIAGNOSIS — Z170 Estrogen receptor positive status [ER+]: Secondary | ICD-10-CM | POA: Diagnosis not present

## 2020-06-06 DIAGNOSIS — C50412 Malignant neoplasm of upper-outer quadrant of left female breast: Secondary | ICD-10-CM | POA: Diagnosis not present

## 2020-06-09 DIAGNOSIS — Z23 Encounter for immunization: Secondary | ICD-10-CM | POA: Diagnosis not present

## 2020-06-09 DIAGNOSIS — E1169 Type 2 diabetes mellitus with other specified complication: Secondary | ICD-10-CM | POA: Diagnosis not present

## 2020-06-09 DIAGNOSIS — E7800 Pure hypercholesterolemia, unspecified: Secondary | ICD-10-CM | POA: Diagnosis not present

## 2020-06-09 DIAGNOSIS — I10 Essential (primary) hypertension: Secondary | ICD-10-CM | POA: Diagnosis not present

## 2020-06-09 DIAGNOSIS — E669 Obesity, unspecified: Secondary | ICD-10-CM | POA: Diagnosis not present

## 2020-10-03 DIAGNOSIS — Z1152 Encounter for screening for COVID-19: Secondary | ICD-10-CM | POA: Diagnosis not present

## 2020-10-05 DIAGNOSIS — R922 Inconclusive mammogram: Secondary | ICD-10-CM | POA: Diagnosis not present

## 2020-10-05 DIAGNOSIS — R928 Other abnormal and inconclusive findings on diagnostic imaging of breast: Secondary | ICD-10-CM | POA: Diagnosis not present

## 2020-10-07 ENCOUNTER — Encounter (HOSPITAL_BASED_OUTPATIENT_CLINIC_OR_DEPARTMENT_OTHER): Payer: Self-pay | Admitting: *Deleted

## 2020-10-07 ENCOUNTER — Other Ambulatory Visit: Payer: Self-pay

## 2020-10-07 ENCOUNTER — Emergency Department (HOSPITAL_BASED_OUTPATIENT_CLINIC_OR_DEPARTMENT_OTHER): Payer: Medicare Other

## 2020-10-07 ENCOUNTER — Emergency Department (HOSPITAL_BASED_OUTPATIENT_CLINIC_OR_DEPARTMENT_OTHER)
Admission: EM | Admit: 2020-10-07 | Discharge: 2020-10-07 | Disposition: A | Discharge status: Home / Self Care | Payer: Medicare Other | Attending: Emergency Medicine | Admitting: Emergency Medicine

## 2020-10-07 DIAGNOSIS — Z79899 Other long term (current) drug therapy: Secondary | ICD-10-CM | POA: Diagnosis not present

## 2020-10-07 DIAGNOSIS — I160 Hypertensive urgency: Secondary | ICD-10-CM | POA: Insufficient documentation

## 2020-10-07 DIAGNOSIS — I672 Cerebral atherosclerosis: Secondary | ICD-10-CM | POA: Diagnosis not present

## 2020-10-07 DIAGNOSIS — Z87891 Personal history of nicotine dependence: Secondary | ICD-10-CM | POA: Insufficient documentation

## 2020-10-07 DIAGNOSIS — H538 Other visual disturbances: Secondary | ICD-10-CM | POA: Diagnosis not present

## 2020-10-07 DIAGNOSIS — I10 Essential (primary) hypertension: Secondary | ICD-10-CM | POA: Diagnosis not present

## 2020-10-07 DIAGNOSIS — Z760 Encounter for issue of repeat prescription: Secondary | ICD-10-CM | POA: Insufficient documentation

## 2020-10-07 DIAGNOSIS — Z1231 Encounter for screening mammogram for malignant neoplasm of breast: Secondary | ICD-10-CM | POA: Diagnosis not present

## 2020-10-07 DIAGNOSIS — C50412 Malignant neoplasm of upper-outer quadrant of left female breast: Secondary | ICD-10-CM | POA: Diagnosis not present

## 2020-10-07 DIAGNOSIS — H532 Diplopia: Secondary | ICD-10-CM | POA: Diagnosis not present

## 2020-10-07 DIAGNOSIS — C50919 Malignant neoplasm of unspecified site of unspecified female breast: Secondary | ICD-10-CM | POA: Diagnosis not present

## 2020-10-07 DIAGNOSIS — Z170 Estrogen receptor positive status [ER+]: Secondary | ICD-10-CM | POA: Diagnosis not present

## 2020-10-07 LAB — CBC WITH DIFFERENTIAL/PLATELET
Abs Immature Granulocytes: 0 10*3/uL (ref 0.00–0.07)
Basophils Absolute: 0 10*3/uL (ref 0.0–0.1)
Basophils Relative: 0 %
Eosinophils Absolute: 0.1 10*3/uL (ref 0.0–0.5)
Eosinophils Relative: 3 %
HCT: 39.6 % (ref 36.0–46.0)
Hemoglobin: 12.1 g/dL (ref 12.0–15.0)
Immature Granulocytes: 0 %
Lymphocytes Relative: 38 %
Lymphs Abs: 2.1 10*3/uL (ref 0.7–4.0)
MCH: 25.3 pg — ABNORMAL LOW (ref 26.0–34.0)
MCHC: 30.6 g/dL (ref 30.0–36.0)
MCV: 82.7 fL (ref 80.0–100.0)
Monocytes Absolute: 0.4 10*3/uL (ref 0.1–1.0)
Monocytes Relative: 8 %
Neutro Abs: 2.9 10*3/uL (ref 1.7–7.7)
Neutrophils Relative %: 51 %
Platelets: 216 10*3/uL (ref 150–400)
RBC: 4.79 MIL/uL (ref 3.87–5.11)
RDW: 13.7 % (ref 11.5–15.5)
WBC: 5.6 10*3/uL (ref 4.0–10.5)
nRBC: 0 % (ref 0.0–0.2)

## 2020-10-07 LAB — COMPREHENSIVE METABOLIC PANEL
ALT: 23 U/L (ref 0–44)
AST: 26 U/L (ref 15–41)
Albumin: 4.3 g/dL (ref 3.5–5.0)
Alkaline Phosphatase: 58 U/L (ref 38–126)
Anion gap: 10 (ref 5–15)
BUN: 14 mg/dL (ref 8–23)
CO2: 27 mmol/L (ref 22–32)
Calcium: 9.6 mg/dL (ref 8.9–10.3)
Chloride: 102 mmol/L (ref 98–111)
Creatinine, Ser: 0.75 mg/dL (ref 0.44–1.00)
GFR, Estimated: >60 mL/min (ref >60)
Glucose, Bld: 97 mg/dL (ref 70–99)
Potassium: 3.9 mmol/L (ref 3.5–5.1)
Sodium: 139 mmol/L (ref 135–145)
Total Bilirubin: 0.5 mg/dL (ref 0.3–1.2)
Total Protein: 8 g/dL (ref 6.5–8.1)

## 2020-10-07 MED ORDER — HYDROCHLOROTHIAZIDE 25 MG PO TABS: 25.0000 mg | ORAL_TABLET | Freq: Every day | ORAL | 0 refills | Status: AC

## 2020-10-07 NOTE — ED Provider Notes (Signed)
[place] EMERGENCY DEPARTMENT Provider Note   CSN: [id number] Arrival date & time: 10/07/20  1630     History Chief Complaint  Patient presents with  . Hypertension    Yesenia Kramer is a 80 y.o. female with a past medical history of prior stroke, hypertension noncompliant with medication presenting to the ED with a chief complaint of hypertension.  States that she was running low on her antihypertensive, hydrochlorothiazide about 4 months ago.  States that she has saved some pills but has not been taking it consistently.  She has been checking her blood pressures at home and has been ranging in the XX123456 systolic.  She did take 1 dose of the HCTZ this morning prior to her appointment for routine follow-up at her cancer surgeon's office this morning.  When she got there her blood pressures were in the 123456 systolic and she was sent to the ER.  She admits that she is in between PCPs at this time and while in the process of obtaining a new PCP, they told her they would not refill her medication until she came in for an appointment.  She denies any headache, chest pain, shortness of breath.  She does endorse gradually worsening blurry vision over the past several weeks but she is unsure if this is "because I need to see my eye doctor and get a new prescription."  She denies any injuries or falls.  No changes to urination.  No numbness or weakness.  HPI     Past Medical History:  Diagnosis Date  . Hyperlipidemia   . Hypertension   . Memory loss   . Stroke [facility]) 05/2011   no paralysis    Patient Active Problem List   Diagnosis Date Noted  . MCI (mild cognitive impairment) 02/28/2015  . Memory loss 02/28/2015  . At risk for sleep apnea 02/28/2015    Past Surgical History:  Procedure Laterality Date  . gums  2006  . TOE SURGERY  aprox. 1979   bilateral small toes     OB History   No obstetric history on file.     Family History  Problem Relation Age of Onset  .  Colon cancer Paternal Aunt   . Cancer Mother        ovarian  . Heart disease Brother   . Hypertension Daughter   . Esophageal cancer Neg Hx   . Rectal cancer Neg Hx   . Stomach cancer Neg Hx     Social History   Tobacco Use  . Smoking status: Former Smoker    Quit date: 09/24/1992    Years since quitting: 28.0  . Smokeless tobacco: Never Used  Substance Use Topics  . Alcohol use: No    Alcohol/week: 0.0 standard drinks  . Drug use: No    Home Medications Prior to Admission medications   Medication Sig Start Date End Date Taking? Authorizing Provider  hydrochlorothiazide (HYDRODIURIL) 25 MG tablet Take 1 tablet (25 mg total) by mouth daily. 10/07/20  Yes Anahit Klumb, PA-C  aspirin 325 MG tablet Take 325 mg by mouth daily.    [provider]  losartan (COZAAR) 50 MG tablet Take 50 mg by mouth daily.  11/30/14   [provider]  Misc Natural Products (RED WINE COMPLEX) CAPS Take by mouth.    [provider]    Allergies    Patient has no known allergies.  Review of Systems   Review of Systems  Constitutional: Negative for  appetite change, chills and fever.  HENT: Negative for ear pain, rhinorrhea, sneezing and sore throat.   Eyes: Positive for visual disturbance. Negative for photophobia.  Respiratory: Negative for cough, chest tightness, shortness of breath and wheezing.   Cardiovascular: Negative for chest pain and palpitations.  Gastrointestinal: Negative for abdominal pain, blood in stool, constipation, diarrhea, nausea and vomiting.  Genitourinary: Negative for dysuria, hematuria and urgency.  Musculoskeletal: Negative for myalgias.  Skin: Negative for rash.  Neurological: Negative for dizziness, weakness and light-headedness.    Physical Exam Updated Vital Signs BP (!) 177/98 (BP Location: Right Arm)   Pulse 64   Temp 98.3 F (36.8 C) (Oral)   Resp 18   Ht 5\' 6"  (1.676 m)   Wt 103.4 kg   SpO2 97%   BMI 36.80 kg/m   Physical  Exam Vitals and nursing note reviewed.  Constitutional:      General: She is not in acute distress.    Appearance: She is well-developed and well-nourished.  HENT:     Head: Normocephalic and atraumatic.     Nose: Nose normal.  Eyes:     General: No scleral icterus.       Right eye: No discharge.        Left eye: No discharge.     Extraocular Movements: EOM normal.     Conjunctiva/sclera: Conjunctivae normal.     Pupils: Pupils are equal, round, and reactive to light.  Cardiovascular:     Rate and Rhythm: Normal rate and regular rhythm.     Pulses: Intact distal pulses.     Heart sounds: Normal heart sounds. No murmur heard. No friction rub. No gallop.   Pulmonary:     Effort: Pulmonary effort is normal. No respiratory distress.     Breath sounds: Normal breath sounds.  Abdominal:     General: Bowel sounds are normal. There is no distension.     Palpations: Abdomen is soft.     Tenderness: There is no abdominal tenderness. There is no guarding.  Musculoskeletal:        General: No edema. Normal range of motion.     Cervical back: Normal range of motion and neck supple.  Skin:    General: Skin is warm and dry.     Findings: No rash.  Neurological:     Mental Status: She is alert and oriented to person, place, and time.     Cranial Nerves: No cranial nerve deficit.     Sensory: No sensory deficit.     Motor: No weakness or abnormal muscle tone.     Coordination: Coordination normal.     Comments: Pupils reactive. No facial asymmetry noted. Cranial nerves appear grossly intact. Sensation intact to light touch on face, BUE and BLE. Strength 5/5 in BUE and BLE.   Psychiatric:        Mood and Affect: Mood and affect normal.     ED Results / Procedures / Treatments   Labs (all labs ordered are listed, but only abnormal results are displayed) Labs Reviewed  CBC WITH DIFFERENTIAL/PLATELET - Abnormal; Notable for the following components:      Result Value   MCH 25.3 (*)     All other components within normal limits  COMPREHENSIVE METABOLIC PANEL    EKG None  Radiology CT Head Wo Contrast  Result Date: 10/07/2020 CLINICAL DATA:  Diploplia.  Hypertension. EXAM: CT HEAD WITHOUT CONTRAST TECHNIQUE: Contiguous axial images were obtained from the base of the skull through  the vertex without intravenous contrast. COMPARISON:  CT head June 07, 2011. FINDINGS: Brain: No evidence of acute large vascular territory infarction, hemorrhage, hydrocephalus, extra-axial collection or mass lesion/mass effect. Patchy white matter hypoattenuation, most likely the sequela of chronic microvascular ischemic change. Basal ganglia mineralization. Vascular: No hyperdense vessel identified. Calcific atherosclerosis. Skull: No acute fracture. Sinuses/Orbits: Visualized sinuses are clear.  Unremarkable orbits. Other: No mastoid effusions. IMPRESSION: 1. No evidence of acute intracranial abnormality. 2. Chronic microvascular ischemic disease. Electronically Signed   By: Margaretha Sheffield MD   On: 10/07/2020 18:07    Procedures Procedures (including critical care time)  Medications Ordered in ED Medications - No data to display  ED Course  I have reviewed the triage vital signs and the nursing notes.  Pertinent labs & imaging results that were available during my care of the patient were reviewed by me and considered in my medical decision making (see chart for details).  Clinical Course as of 10/07/20 1840  Fri Oct 07, 2020  1806 Creatinine: 0.75 [HK]  1806 Hemoglobin: 12.1 [HK]  1806 BP(!): 200/124 [HK]    Clinical Course User Index [HK] Delia Heady, PA-C   MDM Rules/Calculators/A&P                          80 year old female with a past medical history of prior stroke, hypertension noncompliant with HCTZ for the past 4 months presenting to the ED for hypertension.  States that she ran out of her medication but had a few pills left that she would take as needed.  Her blood  pressures have been running in XX123456 systolic when she checks it at home.  Had a routine follow-up visit with her cancer surgeon and was found to be hypertensive to the 123456 systolic.  She was sent to the ER for this.  She does report gradually worsening blurry vision over the past few weeks but she is unsure if this is due to an old prescription for her glasses.  Denies any headache, chest pain, shortness of breath, numbness in arms or legs, injuries or falls.  On exam patient without any neurological deficits.  No numbness or weakness noted on exam.  No facial asymmetry.  Abdomen is soft.  Blood pressure here initially 200/124.  Lab work including CBC, BMP unremarkable, normal creatinine and hemoglobin.  CT of the head done due to her history of stroke, current hypertension as well as her vision changes.  This was negative for acute abnormality. Her blood pressure has improved here without intervention. No signs of endorgan damage at this time. Stressed the importance of being compliant with her medications as this could lead to life-threatening emergency such as stroke. Patient is agreeable to the plan. We will refill her HCTZ and have her follow-up with her primary care provider. Return precautions given.   Patient is hemodynamically stable, in NAD, and able to ambulate in the ED. Evaluation does not show pathology that would require ongoing emergent intervention or inpatient treatment. I explained the diagnosis to the patient. Pain has been managed and has no complaints prior to discharge. Patient is comfortable with above plan and is stable for discharge at this time. All questions were answered prior to disposition. Strict return precautions for returning to the ED were discussed. Encouraged follow up with PCP.   An After Visit Summary was printed and given to the patient.   Portions of this note were generated with Lobby[employer]. Dictation  errors may occur despite best attempts at  proofreading.  Final Clinical Impression(s) / ED Diagnoses Final diagnoses:  Hypertensive urgency  Encounter for medication refill    Rx / DC Orders ED Discharge Orders         Ordered    hydrochlorothiazide (HYDRODIURIL) 25 MG tablet  Daily        10/07/20 1840           Delia Heady, PA-C 10/07/20 1840    Horton, Alvin Critchley, DO 10/08/20 0008

## 2020-10-07 NOTE — Discharge Instructions (Signed)
It is important for you to take your blood pressure medication daily. I have provided you with a refill but your primary care provider will need to monitor that this medication is working for you. Follow-up with your primary care provider. Return to the ER if you start to experience chest pain, shortness of breath, blurry vision that is worsening, severe headache, numbness in arms or legs

## 2020-10-07 NOTE — ED Triage Notes (Addendum)
Sent here by PMD for eval Increased BP at PMD visit today , pt reports out of BP med x 4 months

## 2020-10-28 DIAGNOSIS — Z7984 Long term (current) use of oral hypoglycemic drugs: Secondary | ICD-10-CM | POA: Diagnosis not present

## 2020-10-28 DIAGNOSIS — Z1389 Encounter for screening for other disorder: Secondary | ICD-10-CM | POA: Diagnosis not present

## 2020-10-28 DIAGNOSIS — I69319 Unspecified symptoms and signs involving cognitive functions following cerebral infarction: Secondary | ICD-10-CM | POA: Diagnosis not present

## 2020-10-28 DIAGNOSIS — Z0000 Encounter for general adult medical examination without abnormal findings: Secondary | ICD-10-CM | POA: Diagnosis not present

## 2020-10-28 DIAGNOSIS — E785 Hyperlipidemia, unspecified: Secondary | ICD-10-CM | POA: Diagnosis not present

## 2020-10-28 DIAGNOSIS — C50919 Malignant neoplasm of unspecified site of unspecified female breast: Secondary | ICD-10-CM | POA: Diagnosis not present

## 2020-10-28 DIAGNOSIS — H903 Sensorineural hearing loss, bilateral: Secondary | ICD-10-CM | POA: Diagnosis not present

## 2020-10-28 DIAGNOSIS — R413 Other amnesia: Secondary | ICD-10-CM | POA: Diagnosis not present

## 2020-10-28 DIAGNOSIS — E1169 Type 2 diabetes mellitus with other specified complication: Secondary | ICD-10-CM | POA: Diagnosis not present

## 2020-10-28 DIAGNOSIS — I10 Essential (primary) hypertension: Secondary | ICD-10-CM | POA: Diagnosis not present

## 2020-10-28 DIAGNOSIS — M79605 Pain in left leg: Secondary | ICD-10-CM | POA: Diagnosis not present

## 2020-11-07 DIAGNOSIS — M25562 Pain in left knee: Secondary | ICD-10-CM | POA: Diagnosis not present

## 2020-11-25 DIAGNOSIS — I10 Essential (primary) hypertension: Secondary | ICD-10-CM | POA: Diagnosis not present

## 2020-11-25 DIAGNOSIS — M7050 Other bursitis of knee, unspecified knee: Secondary | ICD-10-CM | POA: Diagnosis not present

## 2020-11-28 DIAGNOSIS — C50412 Malignant neoplasm of upper-outer quadrant of left female breast: Secondary | ICD-10-CM | POA: Diagnosis not present

## 2020-11-28 DIAGNOSIS — Z170 Estrogen receptor positive status [ER+]: Secondary | ICD-10-CM | POA: Diagnosis not present

## 2020-12-15 DIAGNOSIS — H43812 Vitreous degeneration, left eye: Secondary | ICD-10-CM | POA: Diagnosis not present

## 2020-12-15 DIAGNOSIS — H25042 Posterior subcapsular polar age-related cataract, left eye: Secondary | ICD-10-CM | POA: Diagnosis not present

## 2020-12-15 DIAGNOSIS — C50919 Malignant neoplasm of unspecified site of unspecified female breast: Secondary | ICD-10-CM | POA: Diagnosis not present

## 2020-12-15 DIAGNOSIS — H538 Other visual disturbances: Secondary | ICD-10-CM | POA: Diagnosis not present

## 2020-12-19 DIAGNOSIS — M25562 Pain in left knee: Secondary | ICD-10-CM | POA: Diagnosis not present

## 2020-12-20 DIAGNOSIS — H9113 Presbycusis, bilateral: Secondary | ICD-10-CM | POA: Diagnosis not present

## 2020-12-20 DIAGNOSIS — H903 Sensorineural hearing loss, bilateral: Secondary | ICD-10-CM | POA: Diagnosis not present

## 2021-01-31 DIAGNOSIS — Z03818 Encounter for observation for suspected exposure to other biological agents ruled out: Secondary | ICD-10-CM | POA: Diagnosis not present

## 2021-02-06 DIAGNOSIS — M25562 Pain in left knee: Secondary | ICD-10-CM | POA: Diagnosis not present

## 2021-06-06 DIAGNOSIS — C50812 Malignant neoplasm of overlapping sites of left female breast: Secondary | ICD-10-CM | POA: Diagnosis not present

## 2021-06-06 DIAGNOSIS — Z170 Estrogen receptor positive status [ER+]: Secondary | ICD-10-CM | POA: Diagnosis not present

## 2021-06-06 DIAGNOSIS — Z780 Asymptomatic menopausal state: Secondary | ICD-10-CM | POA: Diagnosis not present

## 2021-06-06 DIAGNOSIS — C50412 Malignant neoplasm of upper-outer quadrant of left female breast: Secondary | ICD-10-CM | POA: Diagnosis not present

## 2021-06-06 DIAGNOSIS — Z79811 Long term (current) use of aromatase inhibitors: Secondary | ICD-10-CM | POA: Diagnosis not present

## 2021-06-06 DIAGNOSIS — D649 Anemia, unspecified: Secondary | ICD-10-CM | POA: Diagnosis not present

## 2021-06-12 DIAGNOSIS — D649 Anemia, unspecified: Secondary | ICD-10-CM | POA: Diagnosis not present

## 2021-06-19 DIAGNOSIS — D649 Anemia, unspecified: Secondary | ICD-10-CM | POA: Diagnosis not present

## 2021-06-23 DIAGNOSIS — D509 Iron deficiency anemia, unspecified: Secondary | ICD-10-CM | POA: Diagnosis not present

## 2021-07-31 DIAGNOSIS — H25812 Combined forms of age-related cataract, left eye: Secondary | ICD-10-CM | POA: Diagnosis not present

## 2021-08-08 DIAGNOSIS — K3189 Other diseases of stomach and duodenum: Secondary | ICD-10-CM | POA: Diagnosis not present

## 2021-08-08 DIAGNOSIS — D125 Benign neoplasm of sigmoid colon: Secondary | ICD-10-CM | POA: Diagnosis not present

## 2021-08-08 DIAGNOSIS — K5730 Diverticulosis of large intestine without perforation or abscess without bleeding: Secondary | ICD-10-CM | POA: Diagnosis not present

## 2021-08-08 DIAGNOSIS — K648 Other hemorrhoids: Secondary | ICD-10-CM | POA: Diagnosis not present

## 2021-08-08 DIAGNOSIS — D509 Iron deficiency anemia, unspecified: Secondary | ICD-10-CM | POA: Diagnosis not present

## 2021-08-08 DIAGNOSIS — K635 Polyp of colon: Secondary | ICD-10-CM | POA: Diagnosis not present

## 2021-09-06 DIAGNOSIS — D649 Anemia, unspecified: Secondary | ICD-10-CM | POA: Diagnosis not present

## 2021-09-06 DIAGNOSIS — C50919 Malignant neoplasm of unspecified site of unspecified female breast: Secondary | ICD-10-CM | POA: Diagnosis not present

## 2021-09-06 DIAGNOSIS — Z170 Estrogen receptor positive status [ER+]: Secondary | ICD-10-CM | POA: Diagnosis not present

## 2021-09-06 DIAGNOSIS — C50412 Malignant neoplasm of upper-outer quadrant of left female breast: Secondary | ICD-10-CM | POA: Diagnosis not present

## 2021-10-10 DIAGNOSIS — C50919 Malignant neoplasm of unspecified site of unspecified female breast: Secondary | ICD-10-CM | POA: Diagnosis not present

## 2021-10-10 DIAGNOSIS — C50412 Malignant neoplasm of upper-outer quadrant of left female breast: Secondary | ICD-10-CM | POA: Diagnosis not present

## 2021-10-10 DIAGNOSIS — Z170 Estrogen receptor positive status [ER+]: Secondary | ICD-10-CM | POA: Diagnosis not present

## 2021-10-10 DIAGNOSIS — Z1231 Encounter for screening mammogram for malignant neoplasm of breast: Secondary | ICD-10-CM | POA: Diagnosis not present

## 2021-10-11 DIAGNOSIS — Z170 Estrogen receptor positive status [ER+]: Secondary | ICD-10-CM | POA: Diagnosis not present

## 2021-10-11 DIAGNOSIS — C50412 Malignant neoplasm of upper-outer quadrant of left female breast: Secondary | ICD-10-CM | POA: Diagnosis not present

## 2021-10-27 DIAGNOSIS — H25812 Combined forms of age-related cataract, left eye: Secondary | ICD-10-CM | POA: Diagnosis not present

## 2021-11-15 DIAGNOSIS — Z1389 Encounter for screening for other disorder: Secondary | ICD-10-CM | POA: Diagnosis not present

## 2021-11-15 DIAGNOSIS — Z23 Encounter for immunization: Secondary | ICD-10-CM | POA: Diagnosis not present

## 2021-11-15 DIAGNOSIS — Z0000 Encounter for general adult medical examination without abnormal findings: Secondary | ICD-10-CM | POA: Diagnosis not present

## 2021-11-15 DIAGNOSIS — E1169 Type 2 diabetes mellitus with other specified complication: Secondary | ICD-10-CM | POA: Diagnosis not present

## 2021-11-15 DIAGNOSIS — E785 Hyperlipidemia, unspecified: Secondary | ICD-10-CM | POA: Diagnosis not present

## 2021-11-15 DIAGNOSIS — I69319 Unspecified symptoms and signs involving cognitive functions following cerebral infarction: Secondary | ICD-10-CM | POA: Diagnosis not present

## 2021-11-15 DIAGNOSIS — I10 Essential (primary) hypertension: Secondary | ICD-10-CM | POA: Diagnosis not present

## 2021-11-15 DIAGNOSIS — C50919 Malignant neoplasm of unspecified site of unspecified female breast: Secondary | ICD-10-CM | POA: Diagnosis not present

## 2021-12-15 DIAGNOSIS — K5520 Angiodysplasia of colon without hemorrhage: Secondary | ICD-10-CM | POA: Diagnosis not present

## 2021-12-15 DIAGNOSIS — D509 Iron deficiency anemia, unspecified: Secondary | ICD-10-CM | POA: Diagnosis not present

## 2022-04-08 ENCOUNTER — Encounter (HOSPITAL_BASED_OUTPATIENT_CLINIC_OR_DEPARTMENT_OTHER): Payer: Self-pay | Admitting: Emergency Medicine

## 2022-04-08 ENCOUNTER — Emergency Department (HOSPITAL_BASED_OUTPATIENT_CLINIC_OR_DEPARTMENT_OTHER)
Admission: EM | Admit: 2022-04-08 | Discharge: 2022-04-08 | Disposition: A | Discharge status: Home / Self Care | Payer: Medicare Other | Attending: Emergency Medicine | Admitting: Emergency Medicine

## 2022-04-08 ENCOUNTER — Emergency Department (HOSPITAL_BASED_OUTPATIENT_CLINIC_OR_DEPARTMENT_OTHER): Payer: Medicare Other

## 2022-04-08 ENCOUNTER — Other Ambulatory Visit: Payer: Self-pay

## 2022-04-08 DIAGNOSIS — E119 Type 2 diabetes mellitus without complications: Secondary | ICD-10-CM | POA: Diagnosis not present

## 2022-04-08 DIAGNOSIS — Y9389 Activity, other specified: Secondary | ICD-10-CM | POA: Diagnosis not present

## 2022-04-08 DIAGNOSIS — W19XXXA Unspecified fall, initial encounter: Secondary | ICD-10-CM

## 2022-04-08 DIAGNOSIS — Z7982 Long term (current) use of aspirin: Secondary | ICD-10-CM | POA: Insufficient documentation

## 2022-04-08 DIAGNOSIS — S81801A Unspecified open wound, right lower leg, initial encounter: Secondary | ICD-10-CM | POA: Diagnosis not present

## 2022-04-08 DIAGNOSIS — Y92002 Bathroom of unspecified non-institutional (private) residence single-family (private) house as the place of occurrence of the external cause: Secondary | ICD-10-CM | POA: Diagnosis not present

## 2022-04-08 DIAGNOSIS — W182XXA Fall in (into) shower or empty bathtub, initial encounter: Secondary | ICD-10-CM | POA: Insufficient documentation

## 2022-04-08 DIAGNOSIS — S8991XA Unspecified injury of right lower leg, initial encounter: Secondary | ICD-10-CM | POA: Diagnosis not present

## 2022-04-08 MED ORDER — CEPHALEXIN 500 MG PO CAPS: 500.0000 mg | ORAL_CAPSULE | Freq: Four times a day (QID) | ORAL | 0 refills | Status: AC

## 2022-04-08 NOTE — ED Triage Notes (Signed)
Pt arrives pov, slow gait, endorses fall in shower, lac/skin tear to RLE. Bleeding controlled. Denies hitting head, denies thinners

## 2022-04-08 NOTE — Discharge Instructions (Addendum)
Please keep area clean and dry.  I would like for you to follow-up with your primary care doctor for further evaluation.  Return to the emergency department for any worsening symptoms.

## 2022-04-08 NOTE — ED Provider Notes (Signed)
[place] EMERGENCY DEPARTMENT Provider Note   CSN: [id number] Arrival date & time: 04/08/22  3151     History Chief Complaint  Patient presents with   Yesenia Kramer    Yesenia Kramer is a 81 y.o. female patient who presents to the emergency department after mechanical trip and fall while getting to the shower.  She states that she lost her balance and fell forward striking her right shin against the metal frame of the shower.  Patient was able to take a shower and has been ambulatory although it is painful.  Denies hitting her head or losing consciousness.  Not on any anticoagulation.  Patient is diabetic.   Fall       Home Medications Prior to Admission medications   Medication Sig Start Date End Date Taking? Authorizing Provider  cephALEXin (KEFLEX) 500 MG capsule Take 1 capsule (500 mg total) by mouth 4 (four) times daily. 04/08/22  Yes Myna Bright M, PA-C  aspirin 325 MG tablet Take 325 mg by mouth daily.    [provider]  hydrochlorothiazide (HYDRODIURIL) 25 MG tablet Take 1 tablet (25 mg total) by mouth daily. 10/07/20   Khatri, Hina, PA-C  losartan (COZAAR) 50 MG tablet Take 50 mg by mouth daily.  11/30/14   [provider]  Misc Natural Products (RED WINE COMPLEX) CAPS Take by mouth.    [provider]      Allergies    Simvastatin    Review of Systems   Review of Systems  All other systems reviewed and are negative.   Physical Exam Updated Vital Signs BP (!) 175/88 (BP Location: Left Arm)   Pulse 71   Temp 98.9 F (37.2 C) (Oral)   Resp 18   Ht '5\' 6"'$  (1.676 m)   Wt 98.9 kg   SpO2 93%   BMI 35.19 kg/m  Physical Exam Vitals and nursing note reviewed.  Constitutional:      Appearance: Normal appearance.  HENT:     Head: Normocephalic and atraumatic.  Eyes:     General:        Right eye: No discharge.        Left eye: No discharge.     Conjunctiva/sclera: Conjunctivae normal.  Pulmonary:     Effort: Pulmonary effort  is normal.  Skin:    General: Skin is warm and dry.     Findings: No rash.     Comments: 8 cm superficial abrasion to the right anterior shin.  Bleeding controlled.  Nothing that is suturable.  Neurological:     General: No focal deficit present.     Mental Status: She is alert.  Psychiatric:        Mood and Affect: Mood normal.        Behavior: Behavior normal.     ED Results / Procedures / Treatments   Labs (all labs ordered are listed, but only abnormal results are displayed) Labs Reviewed - No data to display  EKG None  Radiology DG Tibia/Fibula Right  Result Date: 04/08/2022 CLINICAL DATA:  Fall in shower.  Right leg injury and pain. EXAM: RIGHT TIBIA AND FIBULA - 2 VIEW COMPARISON:  None Available. FINDINGS: There is no evidence of fracture or other focal bone lesions. Soft tissues are unremarkable. IMPRESSION: Negative. Electronically Signed   By: Marlaine Hind M.D.   On: 04/08/2022 10:21    Procedures Procedures    Medications Ordered in ED Medications - No data to display  ED Course/  Medical Decision Making/ A&P                           Medical Decision Making Yesenia Kramer is a 81 y.o. female patient who presents to the emergency department today for further evaluation of fall and subsequent leg injury.  Imaging was ordered in triage and I personally reviewed this image and did not see any evidence of fracture dislocations.  I do agree with the radiologist interpretation.  Superficial abrasion of the right shin is not gaping and does not have any pulsatile bleeding.  Nothing that needs to be sutured today.  I discussed appropriate wound care with the patient and daughter at bedside.  They expressed full understanding.  Given her history of diabetes, I will place her on a very short course of antibiotics.  I will have her follow-up with her primary care doctor for further evaluation.  She is safe for discharge.   Amount and/or Complexity of Data Reviewed Radiology:  ordered.   Final Clinical Impression(s) / ED Diagnoses Final diagnoses:  Fall, initial encounter  Wound of right lower extremity, initial encounter    Rx / DC Orders ED Discharge Orders          Ordered    cephALEXin (KEFLEX) 500 MG capsule  4 times daily        04/08/22 1114              Myna Bright [place], [place] 04/08/22 1115    Hayden Rasmussen, MD 04/08/22 [phone]

## 2022-04-08 NOTE — ED Notes (Signed)
Patient report abdominal pain. Patient  has full movement of lower rt leg. Patient can bear weight on the leg.Some swelling and leg has abrasions

## 2022-04-09 DIAGNOSIS — Z79811 Long term (current) use of aromatase inhibitors: Secondary | ICD-10-CM | POA: Diagnosis not present

## 2022-04-09 DIAGNOSIS — Z780 Asymptomatic menopausal state: Secondary | ICD-10-CM | POA: Diagnosis not present

## 2022-04-09 DIAGNOSIS — Z9011 Acquired absence of right breast and nipple: Secondary | ICD-10-CM | POA: Diagnosis not present

## 2022-04-09 DIAGNOSIS — D649 Anemia, unspecified: Secondary | ICD-10-CM | POA: Diagnosis not present

## 2022-04-09 DIAGNOSIS — C50812 Malignant neoplasm of overlapping sites of left female breast: Secondary | ICD-10-CM | POA: Diagnosis not present

## 2022-04-09 DIAGNOSIS — D72819 Decreased white blood cell count, unspecified: Secondary | ICD-10-CM | POA: Diagnosis not present

## 2022-04-09 DIAGNOSIS — Z170 Estrogen receptor positive status [ER+]: Secondary | ICD-10-CM | POA: Diagnosis not present

## 2022-04-09 DIAGNOSIS — C50412 Malignant neoplasm of upper-outer quadrant of left female breast: Secondary | ICD-10-CM | POA: Diagnosis not present

## 2022-04-13 DIAGNOSIS — Z23 Encounter for immunization: Secondary | ICD-10-CM | POA: Diagnosis not present

## 2022-04-26 DIAGNOSIS — Z170 Estrogen receptor positive status [ER+]: Secondary | ICD-10-CM | POA: Diagnosis not present

## 2022-04-26 DIAGNOSIS — C50212 Malignant neoplasm of upper-inner quadrant of left female breast: Secondary | ICD-10-CM | POA: Diagnosis not present

## 2022-04-26 DIAGNOSIS — D630 Anemia in neoplastic disease: Secondary | ICD-10-CM | POA: Diagnosis not present

## 2022-04-26 DIAGNOSIS — C50812 Malignant neoplasm of overlapping sites of left female breast: Secondary | ICD-10-CM | POA: Diagnosis not present

## 2022-05-03 DIAGNOSIS — Z170 Estrogen receptor positive status [ER+]: Secondary | ICD-10-CM | POA: Diagnosis not present

## 2022-05-03 DIAGNOSIS — D630 Anemia in neoplastic disease: Secondary | ICD-10-CM | POA: Diagnosis not present

## 2022-05-03 DIAGNOSIS — C50812 Malignant neoplasm of overlapping sites of left female breast: Secondary | ICD-10-CM | POA: Diagnosis not present

## 2022-05-03 DIAGNOSIS — C50212 Malignant neoplasm of upper-inner quadrant of left female breast: Secondary | ICD-10-CM | POA: Diagnosis not present

## 2022-06-25 DIAGNOSIS — T7840XA Allergy, unspecified, initial encounter: Secondary | ICD-10-CM | POA: Diagnosis not present

## 2022-06-25 DIAGNOSIS — R600 Localized edema: Secondary | ICD-10-CM | POA: Diagnosis not present

## 2022-06-25 DIAGNOSIS — L03119 Cellulitis of unspecified part of limb: Secondary | ICD-10-CM | POA: Diagnosis not present

## 2022-06-27 ENCOUNTER — Encounter (HOSPITAL_BASED_OUTPATIENT_CLINIC_OR_DEPARTMENT_OTHER): Payer: Medicare Other | Attending: Physician Assistant | Admitting: Physician Assistant

## 2022-06-27 DIAGNOSIS — L97812 Non-pressure chronic ulcer of other part of right lower leg with fat layer exposed: Secondary | ICD-10-CM | POA: Diagnosis not present

## 2022-06-27 DIAGNOSIS — Z8673 Personal history of transient ischemic attack (TIA), and cerebral infarction without residual deficits: Secondary | ICD-10-CM | POA: Diagnosis not present

## 2022-06-27 DIAGNOSIS — I890 Lymphedema, not elsewhere classified: Secondary | ICD-10-CM | POA: Diagnosis not present

## 2022-06-27 DIAGNOSIS — B354 Tinea corporis: Secondary | ICD-10-CM | POA: Insufficient documentation

## 2022-06-27 DIAGNOSIS — I10 Essential (primary) hypertension: Secondary | ICD-10-CM | POA: Diagnosis not present

## 2022-06-28 NOTE — Progress Notes (Signed)
Yesenia Kramer, Yesenia Kramer ([id number]) Visit Report for 06/27/2022 Abuse Risk Screen Details Patient Name: Date of Service: Yesenia Kramer, Yesenia Kramer 06/27/2022 9:45 A M Medical Record Number: [id number] Patient Account Number: [id number] Date of Birth/Sex: Treating RN: Mar 23, 1941 (81 y.o. F) Primary Care Harriet Sutphen: Donald Prose Other Clinician: Referring Charley Miske: Treating Loredana Medellin/Extender: Barnie Alderman Weeks in Treatment: 0 Abuse Risk Screen Items Answer ABUSE RISK SCREEN: Has anyone close to you tried to hurt or harm you recentlyo No Do you feel uncomfortable with anyone in your familyo No Has anyone forced you do things that you didnt want to doo No Electronic Signature(s) Signed: 06/27/2022 4:20:32 PM By: Erenest Blank Entered By: Erenest Blank on 06/27/2022 10:18:47 -------------------------------------------------------------------------------- Activities of Daily Living Details Patient Name: Date of Service: Yesenia Kramer, Yesenia Kramer 06/27/2022 9:45 A M Medical Record Number: [id number] Patient Account Number: [id number] Date of Birth/Sex: Treating RN: January 05, 1941 (81 y.o. F) Primary Care Churchill Grimsley: Donald Prose Other Clinician: Referring Christyan Reger: Treating Brittnee Gaetano/Extender: Barnie Alderman Weeks in Treatment: 0 Activities of Daily Living Items Answer Activities of Daily Living (Please select one for each item) Drive Automobile Completely Able T Medications ake Completely Able Use T elephone Completely Able Care for Appearance Completely Able Use T oilet Completely Able Bath / Shower Completely Able Dress Self Completely Able Feed Self Completely Able Walk Completely Able Get In / Out Bed Completely Able Housework Completely Able Prepare Meals Completely [place] for Self Completely Able Electronic Signature(s) Signed: 06/27/2022 4:20:32 PM By: Erenest Blank Entered By: Erenest Blank on 06/27/2022  10:19:20 -------------------------------------------------------------------------------- Education Screening Details Patient Name: Date of Service: Yesenia Hacker. 06/27/2022 9:45 A M Medical Record Number: [id number] Patient Account Number: [id number] Date of Birth/Sex: Treating RN: April 12, 1941 (81 y.o. F) Primary Care Shaily Librizzi: Donald Prose Other Clinician: Referring Ala Kratz: Treating Naziah Weckerly/Extender: Barnie Alderman Weeks in Treatment: 0 Primary Learner Assessed: Patient Learning Preferences/Education Level/Primary Language Learning Preference: Explanation, Demonstration, Printed Material Highest Education Level: College or Above Preferred Language: English Cognitive Barrier Language Barrier: No Translator Needed: No Memory Deficit: No Emotional Barrier: No Cultural/Religious Beliefs Affecting Medical Care: No Physical Barrier Impaired Vision: Yes Glasses Impaired Hearing: No Decreased Hand dexterity: No Knowledge/Comprehension Knowledge Level: High Comprehension Level: High Ability to understand written instructions: High Ability to understand verbal instructions: High Motivation Anxiety Level: Calm Cooperation: Cooperative Education Importance: Acknowledges Need Interest in Health Problems: Asks Questions Perception: Coherent Willingness to Engage in Self-Management High Activities: Readiness to Engage in Self-Management High Activities: Electronic Signature(s) Signed: 06/27/2022 4:20:32 PM By: Erenest Blank Entered By: Erenest Blank on 06/27/2022 10:20:14 -------------------------------------------------------------------------------- Fall Risk Assessment Details Patient Name: Date of Service: Yesenia Hacker. 06/27/2022 9:45 A M Medical Record Number: [id number] Patient Account Number: [id number] Date of Birth/Sex: Treating RN: 04/18/1941 (81 y.o. F) Primary Care Jacia Sickman: Donald Prose Other Clinician: Referring Undray Allman: Treating  Xavius Spadafore/Extender: Barnie Alderman Weeks in Treatment: 0 Fall Risk Assessment Items Have you had 2 or more falls in the last 12 monthso 0 No Have you had any fall that resulted in injury in the last 12 monthso 0 Yes FALLS RISK SCREEN History of falling - immediate or within 3 months 0 No Secondary diagnosis (Do you have 2 or more medical diagnoseso) 0 No Ambulatory aid None/bed rest/wheelchair/nurse 0 Yes Crutches/cane/walker 0 No Furniture 0 No Intravenous therapy Access/Saline/Heparin Lock 0 No Gait/Transferring Normal/ bed rest/ wheelchair 0 Yes Weak (short steps with or without shuffle, stooped  but able to lift head while walking, may seek 0 No support from furniture) Impaired (short steps with shuffle, may have difficulty arising from chair, head down, impaired 0 No balance) Mental Status Oriented to own ability 0 Yes Electronic Signature(s) Signed: 06/27/2022 4:20:32 PM By: Erenest Blank Entered By: Erenest Blank on 06/27/2022 10:20:42 -------------------------------------------------------------------------------- Foot Assessment Details Patient Name: Date of Service: Yesenia Hacker. 06/27/2022 9:45 A M Medical Record Number: [id number] Patient Account Number: [id number] Date of Birth/Sex: Treating RN: Aug 09, 1941 (81 y.o. [name] Primary Care Ethne Jeon: Donald Prose Other Clinician: Referring Rebeccah Ivins: Treating Jaser Fullen/Extender: Barnie Alderman Weeks in Treatment: 0 Foot Assessment Items Site Locations + = Sensation present, - = Sensation absent, C = Callus, U = Ulcer R = Redness, W = Warmth, M = Maceration, PU = Pre-ulcerative lesion F = Fissure, S = Swelling, D = Dryness Assessment Right: Left: Other Deformity: No No Prior Foot Ulcer: No No Prior Amputation: No No Charcot Joint: No No Ambulatory Status: Ambulatory Without Help Gait: Steady Electronic Signature(s) Signed: 06/28/2022 5:16:03 PM By: Deon Pilling RN,  BSN Entered By: Deon Pilling on 06/27/2022 10:15:46 -------------------------------------------------------------------------------- Nutrition Risk Screening Details Patient Name: Date of Service: Yesenia Kramer, Yesenia Kramer 06/27/2022 9:45 A M Medical Record Number: [id number] Patient Account Number: [id number] Date of Birth/Sex: Treating RN: 1941-04-01 (81 y.o. Helene Shoe, Tammi Klippel Primary Care Karim Aiello: Donald Prose Other Clinician: Referring Shyan Scalisi: Treating Issaac Shipper/Extender: Barnie Alderman Weeks in Treatment: 0 Height (in): 65 Weight (lbs): 217 Body Mass Index (BMI): 36.1 Nutrition Risk Screening Items Score Screening NUTRITION RISK SCREEN: I have an illness or condition that made me change the kind and/or amount of food I eat 2 Yes I eat fewer than two meals per day 0 No I eat few fruits and vegetables, or milk products 0 No I have three or more drinks of beer, liquor or wine almost every day 0 No I have tooth or mouth problems that make it hard for me to eat 0 No I don't always have enough money to buy the food I need 0 No I eat alone most of the time 0 No I take three or more different prescribed or over-the-counter drugs a day 1 Yes Without wanting to, I have lost or gained 10 pounds in the last six months 0 No I am not always physically able to shop, cook and/or feed myself 0 No Nutrition Protocols Good Risk Protocol Moderate Risk Protocol 0 Provide education on nutrition High Risk Proctocol Risk Level: Moderate Risk Score: 3 Electronic Signature(s) Signed: 06/28/2022 5:16:03 PM By: Deon Pilling RN, BSN Entered By: Deon Pilling on 06/27/2022 10:15:37

## 2022-06-28 NOTE — Progress Notes (Signed)
Yesenia Kramer, Yesenia Kramer ([id number]) Visit Report for 06/27/2022 Chief Complaint Document Details Patient Name: Date of Service: Yesenia Kramer, Yesenia Kramer 06/27/2022 9:45 A M Medical Record Number: [id number] Patient Account Number: [id number] Date of Birth/Sex: Treating RN: [birth year] (81 y.o. F) Primary Care Provider: Donald Prose Other Clinician: Referring Provider: Treating Provider/Extender: Whitman Hero, Gari Crown Weeks in Treatment: 0 Information Obtained from: Patient Chief Complaint Right LE Ulcer Electronic Signature(s) Signed: 06/27/2022 10:42:54 AM By: Worthy Keeler PA-C Entered By: Worthy Keeler on 06/27/2022 10:42:53 -------------------------------------------------------------------------------- Debridement Details Patient Name: Date of Service: Yesenia Hacker. 06/27/2022 9:45 A M Medical Record Number: [id number] Patient Account Number: [id number] Date of Birth/Sex: Treating RN: 1941-07-18 (81 y.o. [name] Primary Care Provider: Donald Prose Other Clinician: Referring Provider: Treating Provider/Extender: Barnie Alderman Weeks in Treatment: 0 Debridement Performed for Assessment: Wound #1 Right,Anterior Lower Leg Performed By: Physician Worthy Keeler, PA Debridement Type: Chemical/Enzymatic/Mechanical Agent Used: gauze and wound cleanser Level of Consciousness (Pre-procedure): Awake and Alert Pre-procedure Verification/Time Out No Taken: Bleeding: None Response to Treatment: Procedure was tolerated well Level of Consciousness (Post- Awake and Alert procedure): Post Debridement Measurements of Total Wound Length: (cm) 2 Width: (cm) 0.3 Depth: (cm) 0.1 Volume: (cm) 0.047 Character of Wound/Ulcer Post Debridement: Stable Post Procedure Diagnosis Same as Pre-procedure Electronic Signature(s) Signed: 06/27/2022 5:41:10 PM By: Worthy Keeler PA-C Signed: 06/28/2022 5:16:03 PM By: Deon Pilling RN, BSN Entered By: Deon Pilling on 06/27/2022  10:59:23 -------------------------------------------------------------------------------- HPI Details Patient Name: Date of Service: Yesenia Hacker. 06/27/2022 9:45 A M Medical Record Number: [id number] Patient Account Number: [id number] Date of Birth/Sex: Treating RN: 15-Jul-1941 (81 y.o. F) Primary Care Provider: Donald Prose Other Clinician: Referring Provider: Treating Provider/Extender: Barnie Alderman Weeks in Treatment: 0 History of Present Illness HPI Description: 06-27-2022 upon evaluation patient presents for initial evaluation here in the clinic concerning a wound over the anterior shin of the right lower extremity which occurred when she fell in the shower July 2023. She tells me that since that time she has been having a lot of issues here with getting this to heal. She tells me that initially she seemed to do decently well with regard to healing and so she ended up using a DuoDERM dressing that she purchased over- the-counter which subsequently led to increased pain as far as the wound itself was concerned. Following this she states that she developed a rash both around the wound which was painful as well as a rash more recently on her torso and upper body as well. I am unsure if that upper body rash is related or separate completely from what is going on around her leg. The region around the leg appears to be potentially somewhat fungal. Patient was seen by her primary care provider on Monday of this week just 2 days ago and was given oral steroids as well as Keflex and seems to be doing somewhat better based on what she tells me today. This is actually really good news. I do not see any evidence of active worsening infection at this time. With that being said I do think there is a need for her to continue the Keflex as there is definitely some signs of potential cellulitis also believe there could be some fungal infection going on around the region of the wound in  general. Patient does have a history of hypertension, TIAs, and lower extremity bilateral lymphedema. Electronic Signature(s) Signed: 06/27/2022 5:26:43 PM  By: Worthy Keeler PA-C Entered By: Worthy Keeler on 06/27/2022 17:26:42 -------------------------------------------------------------------------------- Physical Exam Details Patient Name: Date of Service: Yesenia Kramer, Yesenia Kramer 06/27/2022 9:45 A M Medical Record Number: [id number] Patient Account Number: [id number] Date of Birth/Sex: Treating RN: [birth year] (81 y.o. F) Primary Care Provider: Donald Prose Other Clinician: Referring Provider: Treating Provider/Extender: Barnie Alderman Weeks in Treatment: 0 Constitutional patient is hypertensive.. pulse regular and within target range for patient.Marland Kitchen respirations regular, non-labored and within target range for patient.Marland Kitchen temperature within target range for patient.. Well-nourished and well-hydrated in no acute distress. Eyes conjunctiva clear no eyelid edema noted. pupils equal round and reactive to light and accommodation. Ears, Nose, Mouth, and Throat no gross abnormality of ear auricles or external auditory canals. normal hearing noted during conversation. mucus membranes moist. Respiratory normal breathing without difficulty. Cardiovascular 2+ dorsalis pedis/posterior tibialis pulses. trace pitting edema of the bilateral lower extremities. Musculoskeletal normal gait and posture. no significant deformity or arthritic changes, no loss or range of motion, no clubbing. Psychiatric this patient is able to make decisions and demonstrates good insight into disease process. Alert and Oriented x 3. pleasant and cooperative. Notes Upon inspection patient's wound actually showed signs of healing nicely as far as the actual wound was concerned but unfortunately she has been having a lot more issues with the periwound. This actually appears to potentially be fungal in nature and I  discussed that with her today. Nonetheless I do believe that she would potentially benefit from some mild compression also think topical antifungal medication could be beneficial specifically I think nystatin cream would be a good option here. Electronic Signature(s) Signed: 06/27/2022 5:27:26 PM By: Worthy Keeler PA-C Entered By: Worthy Keeler on 06/27/2022 17:27:26 -------------------------------------------------------------------------------- Physician Orders Details Patient Name: Date of Service: Yesenia Kramer 06/27/2022 9:45 A M Medical Record Number: [id number] Patient Account Number: [id number] Date of Birth/Sex: Treating RN: 09/22/1941 (81 y.o. 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 Jeri Cos, PA and [place], Room 8 Return A Other: - Continue the oral steroids as prescribed by your primary care provider. ***Pick up topical Nystatin cream at your pharmacy to right lower leg daily.**** ****STOP Keflex oral antibiotic and pick up the new one Bactrim from pharmacy and start today.*** Anesthetic (In clinic) Topical Lidocaine 5% applied to wound bed Bathing/ Shower/ Hygiene May shower and wash wound with soap and water. - with dressing changes. Edema Control - Lymphedema / SCD / Other Elevate legs to the level of the heart or above for 30 minutes daily and/or when sitting, a frequency of: - 3-4 times a day throughout the  day. Avoid standing for long periods of time. Exercise regularly Wound Treatment Wound #1 - Lower Leg Wound Laterality: Right, Anterior Cleanser: Soap and Water 1 x Per Day/30 Days Discharge Instructions: May shower and wash wound with dial antibacterial soap and water prior to dressing change. Peri-Wound Care: Ketoconazole Cream 2% 1 x Per Day/30 Days Discharge Instructions: Apply Ketoconazole ****ONLY IN CLINIC.*** Peri-Wound Care: Nystatin ointment 1 x Per  Day/30 Days Discharge Instructions: pick up at pharmacy. apply daily to right leg and wound bed. Prim Dressing: Hydrofera Blue Ready Foam, 4x5 in 1 x Per Day/30 Days ary Discharge Instructions: Apply to wound bed as instructed Secondary Dressing: ABD Pad, 8x10 1 x Per Day/30 Days Discharge Instructions: Apply over primary dressing as directed. Secured With: [employer], 4.5x3.1 (in/yd) 1 x Per Day/30 Days Discharge Instructions: Secure with Kerlix as directed. Secured With: 16M Medipore H Soft Cloth Surgical Tape, 4 x 10 (in/yd) 1 x Per Day/30 Days Discharge Instructions: Secure with tape as directed. Secured With: tubigrip size E 1 x Per Day/30 Days Discharge Instructions: apply over the dressing to right leg- 1 layer. Patient Medications llergies: simvastatin A Notifications Medication Indication Start End 06/27/2022 lidocaine DOSE topical 5 % cream - cream topical applied only in clinic for debridements. 06/27/2022 Bactrim DS DOSE 1 - oral 800 mg-160 mg tablet - 1 tablet oral taken 2 times per day for 14 days. 06/27/2022 nystatin DOSE topical 100,000 unit/gram cream - cream topical applied in a thin film to the right leg 1 time per day x 30 days Electronic Signature(s) Signed: 06/27/2022 11:06:05 AM By: Worthy Keeler PA-C Entered By: Worthy Keeler on 06/27/2022 11:06:05 -------------------------------------------------------------------------------- Problem List Details Patient Name: Date of Service: Yesenia Hacker. 06/27/2022 9:45 A M Medical Record Number: [id number] Patient Account Number: [id number] Date of Birth/Sex: Treating RN: December 16, 1940 (81 y.o. F) Primary Care Provider: Donald Prose Other Clinician: Referring Provider: Treating Provider/Extender: Whitman Hero, Gari Crown Weeks in Treatment: 0 Active Problems ICD-10 Encounter Code Description Active Date MDM Diagnosis I89.0 Lymphedema, not elsewhere classified 06/27/2022 No Yes B35.4 Tinea corporis 06/27/2022 No Yes L97.812 Non-pressure chronic ulcer of other part of right lower leg with fat layer 06/27/2022 No Yes exposed [place] (primary) hypertension 06/27/2022 No Yes Z86.73 Personal history of transient ischemic attack (TIA), and cerebral infarction 06/27/2022 No Yes without residual deficits Inactive Problems Resolved Problems Electronic Signature(s) Signed: 06/27/2022 10:42:38 AM By: Worthy Keeler PA-C Entered By: Worthy Keeler on 06/27/2022 10:42:38 -------------------------------------------------------------------------------- Progress Note Details Patient Name: Date of Service: Yesenia Hacker. 06/27/2022 9:45 A M Medical Record Number: [id number] Patient Account Number: [id number] Date of Birth/Sex: Treating RN: [birth year] (81 y.o. F) Primary Care Provider: Donald Prose Other Clinician: Referring Provider: Treating Provider/Extender: Barnie Alderman Weeks in Treatment: 0 Subjective Chief Complaint Information obtained from Patient Right LE Ulcer History of Present Illness (HPI) 06-27-2022 upon evaluation patient presents for initial evaluation here in the clinic concerning a wound over the anterior shin of the right lower extremity which occurred when she fell in the shower July 2023. She tells me that since that time she has been having a lot of issues here with getting this to heal. She tells me that initially she seemed to do decently well with regard to healing and so she ended up using a  DuoDERM dressing that she purchased over-the-counter which subsequently led to increased pain as far as the wound itself was concerned. Following this she states that she developed a rash both around the wound which was painful as well as a rash more recently on her torso and upper body as well. I am unsure if that upper body rash is related or separate completely from what is going on around her leg. The region around the leg appears to be potentially somewhat fungal. Patient was seen by her primary care provider on Monday of  this week just 2 days ago and was given oral steroids as well as Keflex and seems to be doing somewhat better based on what she tells me today. This is actually really good news. I do not see any evidence of active worsening infection at this time. With that being said I do think there is a need for her to continue the Keflex as there is definitely some signs of potential cellulitis also believe there could be some fungal infection going on around the region of the wound in general. Patient does have a history of hypertension, TIAs, and lower extremity bilateral lymphedema. Patient History Information obtained from Chart. Allergies simvastatin (Reaction: "felt bad") Family History Cancer - Mother, Diabetes - Maternal Grandparents, Hypertension - Child, Lung Disease - Siblings, Thyroid Problems - Siblings, No family history of Heart Disease, Hereditary Spherocytosis, Kidney Disease, Seizures, Stroke, Tuberculosis. Social History Former smoker - quit 1994, Marital Status - Widowed, Alcohol Use - Never, Drug Use - No History, Caffeine Use - Daily. Medical History Eyes Patient has history of Cataracts - removed Denies history of Glaucoma, Optic Neuritis Ear/Nose/Mouth/Throat Denies history of Chronic sinus problems/congestion, Middle ear problems Hematologic/Lymphatic Patient has history of Anemia Denies history of Hemophilia, Human Immunodeficiency Virus, Lymphedema,  Sickle Cell Disease Respiratory Denies history of Aspiration, Asthma, Chronic Obstructive Pulmonary Disease (COPD), Pneumothorax, Sleep Apnea, Tuberculosis Cardiovascular Patient has history of Hypertension Denies history of Angina, Arrhythmia, Congestive Heart Failure, Coronary Artery Disease, Deep Vein Thrombosis, Hypotension, Myocardial Infarction, Peripheral Arterial Disease, Peripheral Venous Disease, Phlebitis, Vasculitis Gastrointestinal Denies history of Cirrhosis , Colitis, Crohnoos, Hepatitis A, Hepatitis B, Hepatitis C Endocrine Denies history of Type I Diabetes, Type II Diabetes Genitourinary Denies history of End Stage Renal Disease Immunological Denies history of Lupus Erythematosus, Raynaudoos, Scleroderma Musculoskeletal Denies history of Gout, Rheumatoid Arthritis, Osteoarthritis, Osteomyelitis Neurologic Denies history of Dementia, Neuropathy, Quadriplegia, Paraplegia, Seizure Disorder Oncologic Denies history of Received Chemotherapy, Received Radiation Hospitalization/Surgery History - 1979 5th toes bilaterally had surgery. - 2006 gum surgery. - lumpetomy 2019. - cataracts removed 2022. Medical A Surgical History Notes nd Constitutional Symptoms (General Health) July 2023 fell in shower causing an abrasion to right leg. 2012 stroke Cardiovascular hyperlipideima Endocrine Prediabetic Neurologic mini stroke Oncologic Takes Chemo pills Review of Systems (ROS) Eyes Complains or has symptoms of Glasses / Contacts. Denies complaints or symptoms of Dry Eyes, Vision Changes. Ear/Nose/Mouth/Throat Denies complaints or symptoms of Chronic sinus problems or rhinitis. Respiratory Denies complaints or symptoms of Chronic or frequent coughs, Shortness of Breath. Cardiovascular Denies complaints or symptoms of Chest pain. Gastrointestinal Denies complaints or symptoms of Frequent diarrhea, Nausea, Vomiting. Endocrine Denies complaints or symptoms of Heat/cold  intolerance. Genitourinary Denies complaints or symptoms of Frequent urination. Integumentary (Skin) Complains or has symptoms of Wounds - right lower leg. Musculoskeletal Denies complaints or symptoms of Muscle Pain, Muscle Weakness. Neurologic Denies complaints or symptoms of Numbness/parasthesias. Psychiatric Denies complaints or symptoms of Claustrophobia. Objective Constitutional patient is hypertensive.. pulse regular and within target range for patient.Marland Kitchen respirations regular, non-labored and within target range for patient.Marland Kitchen temperature within target range for patient.. Well-nourished and well-hydrated in no acute distress. Vitals Time Taken: 9:55 AM, Height: 65 in, Source: Stated, Weight: 217 lbs, Source: Stated, BMI: 36.1, Temperature: 98.3 F, Pulse: 60 bpm, Respiratory Rate: 18 breaths/min, Blood Pressure: 144/86 mmHg. Eyes conjunctiva clear no eyelid edema noted. pupils equal round and reactive to light and accommodation. Ears, Nose, Mouth, and Throat no gross abnormality of ear auricles or external auditory canals. normal hearing  noted during conversation. mucus membranes moist. Respiratory normal breathing without difficulty. Cardiovascular 2+ dorsalis pedis/posterior tibialis pulses. trace pitting edema of the bilateral lower extremities. Musculoskeletal normal gait and posture. no significant deformity or arthritic changes, no loss or range of motion, no clubbing. Psychiatric this patient is able to make decisions and demonstrates good insight into disease process. Alert and Oriented x 3. pleasant and cooperative. General Notes: Upon inspection patient's wound actually showed signs of healing nicely as far as the actual wound was concerned but unfortunately she has been having a lot more issues with the periwound. This actually appears to potentially be fungal in nature and I discussed that with her today. Nonetheless I do believe that she would potentially benefit  from some mild compression also think topical antifungal medication could be beneficial specifically I think nystatin cream would be a good option here. Integumentary (Hair, Skin) Wound #1 status is Open. Original cause of wound was Trauma. The date acquired was: 04/05/2022. The wound is located on the Right,Anterior Lower Leg. The wound measures 2cm length x 0.3cm width x 0.1cm depth; 0.471cm^2 area and 0.047cm^3 volume. There is no tunneling or undermining noted. There is a medium amount of serosanguineous drainage noted. The wound margin is distinct with the outline attached to the wound base. There is no granulation within the wound bed. There is a large (67-100%) amount of necrotic tissue within the wound bed including Adherent Slough. The periwound skin appearance exhibited: Rash, Dry/Scaly, Hemosiderin Staining, Erythema. The periwound skin appearance did not exhibit: Callus, Crepitus, Excoriation, Induration, Scarring, Maceration, Atrophie Blanche, Cyanosis, Ecchymosis, Mottled, Pallor, Rubor. The surrounding wound skin color is noted with erythema which is circumferential. Periwound temperature was noted as Hot. The periwound has tenderness on palpation. General Notes: Itching and burning to Periwound. Assessment Active Problems ICD-10 Lymphedema, not elsewhere classified Tinea corporis Non-pressure chronic ulcer of other part of right lower leg with fat layer exposed Essential (primary) hypertension Personal history of transient ischemic attack (TIA), and cerebral infarction without residual deficits Procedures Wound #1 Pre-procedure diagnosis of Wound #1 is a Lymphedema located on the Right,Anterior Lower Leg . There was a Chemical/Enzymatic/Mechanical debridement performed by Worthy Keeler, PA.. Other agent used was gauze and wound cleanser. There was no bleeding. The procedure was tolerated well. Post Debridement Measurements: 2cm length x 0.3cm width x 0.1cm depth; 0.047cm^3  volume. Character of Wound/Ulcer Post Debridement is stable. Post procedure Diagnosis Wound #1: Same as Pre-Procedure Plan Follow-up Appointments: Return Appointment in 1 week. Jeri Cos, PA and [place], Room 8 Other: - Continue the oral steroids as prescribed by your primary care provider. ***Pick up topical Nystatin cream at your pharmacy to right lower leg daily.**** ****STOP Keflex oral antibiotic and pick up the new one Bactrim from pharmacy and start today.*** Anesthetic: (In clinic) Topical Lidocaine 5% applied to wound bed Bathing/ Shower/ Hygiene: May shower and wash wound with soap and water. - with dressing changes. Edema Control - Lymphedema / SCD / Other: Elevate legs to the level of the heart or above for 30 minutes daily and/or when sitting, a frequency of: - 3-4 times a day throughout the day. Avoid standing for long periods of time. Exercise regularly The following medication(s) was prescribed: lidocaine topical 5 % cream cream topical applied only in clinic for debridements. was prescribed at facility Bactrim DS oral 800 mg-160 mg tablet 1 1 tablet oral taken 2 times per day for 14 days. starting 06/27/2022 nystatin topical 100,000 unit/gram cream cream  topical applied in a thin film to the right leg 1 time per day x 30 days starting 06/27/2022 WOUND #1: - Lower Leg Wound Laterality: Right, Anterior Cleanser: Soap and Water 1 x Per Day/30 Days Discharge Instructions: May shower and wash wound with dial antibacterial soap and water prior to dressing change. Peri-Wound Care: Ketoconazole Cream 2% 1 x Per Day/30 Days Discharge Instructions: Apply Ketoconazole ****ONLY IN CLINIC.*** Peri-Wound Care: Nystatin ointment 1 x Per Day/30 Days Discharge Instructions: pick up at pharmacy. apply daily to right leg and wound bed. Prim Dressing: Hydrofera Blue Ready Foam, 4x5 in 1 x Per Day/30 Days ary Discharge Instructions: Apply to wound bed as instructed Secondary Dressing: ABD  Pad, 8x10 1 x Per Day/30 Days Discharge Instructions: Apply over primary dressing as directed. Secured With: [employer], 4.5x3.1 (in/yd) 1 x Per Day/30 Days Discharge Instructions: Secure with Kerlix as directed. Secured With: 66M Medipore H Soft Cloth Surgical T ape, 4 x 10 (in/yd) 1 x Per Day/30 Days Discharge Instructions: Secure with tape as directed. Secured With: tubigrip size E 1 x Per Day/30 Days Discharge Instructions: apply over the dressing to right leg- 1 layer. 1. Based on what I am seeing I would recommend that we have the patient go ahead and initiate treatment with the nystatin cream to the periwound and over the wound itself. 2. Based on the fact that she did not have much of the cephalexin actually get a go ahead and initiate treatment with Bactrim I did send that into the pharmacy for today along with the nystatin cream. 3. I am going to recommend that we initiate treatment with a Hydrofera Blue dressing over the actual open wound area followed by an ABD pad and roll gauze to secure in place and then Tubigrip size E. We will see patient back for reevaluation in 1 week here in the clinic. If anything worsens or changes patient will contact our office for additional recommendations. Electronic Signature(s) Signed: 06/27/2022 5:28:13 PM By: Worthy Keeler PA-C Entered By: Worthy Keeler on 06/27/2022 17:28:13 -------------------------------------------------------------------------------- HxROS Details Patient Name: Date of Service: Yesenia Hacker. 06/27/2022 9:45 A M Medical Record Number: [id number] Patient Account Number: [id number] Date of Birth/Sex: Treating RN: 03/23/1941 (81 y.o. [name] Primary Care Provider: Donald Prose Other Clinician: Referring Provider: Treating Provider/Extender: Barnie Alderman Weeks in Treatment: 0 Information Obtained From Chart Eyes Complaints and Symptoms: Positive for: Glasses / Contacts Negative  for: Dry Eyes; Vision Changes Medical History: Positive for: Cataracts - removed Negative for: Glaucoma; Optic Neuritis Ear/Nose/Mouth/Throat Complaints and Symptoms: Negative for: Chronic sinus problems or rhinitis Medical History: Negative for: Chronic sinus problems/congestion; Middle ear problems Respiratory Complaints and Symptoms: Negative for: Chronic or frequent coughs; Shortness of Breath Medical History: Negative for: Aspiration; Asthma; Chronic Obstructive Pulmonary Disease (COPD); Pneumothorax; Sleep Apnea; Tuberculosis Cardiovascular Complaints and Symptoms: Negative for: Chest pain Medical History: Positive for: Hypertension Negative for: Angina; Arrhythmia; Congestive Heart Failure; Coronary Artery Disease; Deep Vein Thrombosis; Hypotension; Myocardial Infarction; Peripheral Arterial Disease; Peripheral Venous Disease; Phlebitis; Vasculitis Past Medical History Notes: hyperlipideima Gastrointestinal Complaints and Symptoms: Negative for: Frequent diarrhea; Nausea; Vomiting Medical History: Negative for: Cirrhosis ; Colitis; Crohns; Hepatitis A; Hepatitis B; Hepatitis C Endocrine Complaints and Symptoms: Negative for: Heat/cold intolerance Medical History: Negative for: Type I Diabetes; Type II Diabetes Past Medical History Notes: Prediabetic Genitourinary Complaints and Symptoms: Negative for: Frequent urination Medical History: Negative for: End Stage Renal Disease Integumentary (Skin) Complaints and  Symptoms: Positive for: Wounds - right lower leg Musculoskeletal Complaints and Symptoms: Negative for: Muscle Pain; Muscle Weakness Medical History: Negative for: Gout; Rheumatoid Arthritis; Osteoarthritis; Osteomyelitis Neurologic Complaints and Symptoms: Negative for: Numbness/parasthesias Medical History: Negative for: Dementia; Neuropathy; Quadriplegia; Paraplegia; Seizure Disorder Past Medical History Notes: mini stroke Psychiatric Complaints  and Symptoms: Negative for: Claustrophobia Constitutional Symptoms (General Health) Medical History: Past Medical History Notes: July 2023 fell in shower causing an abrasion to right leg. 2012 stroke Hematologic/Lymphatic Medical History: Positive for: Anemia Negative for: Hemophilia; Human Immunodeficiency Virus; Lymphedema; Sickle Cell Disease Immunological Medical History: Negative for: Lupus Erythematosus; Raynauds; Scleroderma Oncologic Medical History: Negative for: Received Chemotherapy; Received Radiation Past Medical History Notes: Takes Chemo pills HBO Extended History Items Eyes: Cataracts Immunizations Pneumococcal Vaccine: Received Pneumococcal Vaccination: No Implantable Devices None Hospitalization / Surgery History Type of Hospitalization/Surgery 1979 5th toes bilaterally had surgery 2006 gum surgery lumpetomy 2019 cataracts removed 2022 Family and Social History Cancer: Yes - Mother; Diabetes: Yes - Maternal Grandparents; Heart Disease: No; Hereditary Spherocytosis: No; Hypertension: Yes - Child; Kidney Disease: No; Lung Disease: Yes - Siblings; Seizures: No; Stroke: No; Thyroid Problems: Yes - Siblings; Tuberculosis: No; Former smoker - quit 1994; Marital Status - Widowed; Alcohol Use: Never; Drug Use: No History; Caffeine Use: Daily; Financial Concerns: No; Food, Clothing or Shelter Needs: No; Support System Lacking: No; Transportation Concerns: No Electronic Signature(s) Signed: 06/27/2022 4:20:32 PM By: Erenest Blank Signed: 06/27/2022 5:41:10 PM By: Worthy Keeler PA-C Signed: 06/28/2022 5:16:03 PM By: Deon Pilling RN, BSN Entered By: Erenest Blank on 06/27/2022 10:18:32 -------------------------------------------------------------------------------- SuperBill Details Patient Name: Date of Service: BELKY, MUNDO 06/27/2022 Medical Record Number: [id number] Patient Account Number: [id number] Date of Birth/Sex: Treating RN: [birth year] (81 y.o.  Helene Shoe, Tammi Klippel Primary Care Provider: Donald Prose Other Clinician: Referring Provider: Treating Provider/Extender: Barnie Alderman Weeks in Treatment: 0 Diagnosis Coding ICD-10 Codes Code Description I89.0 Lymphedema, not elsewhere classified B35.4 Tinea corporis L97.812 Non-pressure chronic ulcer of other part of right lower leg with fat layer exposed I10 Essential (primary) hypertension Z86.73 Personal history of transient ischemic attack (TIA), and cerebral infarction without residual deficits Facility Procedures CPT4 Code: 01027253 Description: [place] VISIT-LEV 3 EST PT Modifier: Quantity: 1 CPT4 Code: 66440347 Description: 42595 - [place] W/O ANES NON SELECT Modifier: Quantity: 1 Physician Procedures : CPT4 Code Description Modifier [id number] 33295 - WC PHYS LEVEL 4 - NEW PT ICD-10 Diagnosis Description I89.0 Lymphedema, not elsewhere classified B35.4 Tinea corporis 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

## 2022-06-28 NOTE — Progress Notes (Signed)
Yesenia, Kramer ([id number]) Visit Report for 06/27/2022 Allergy List Details Patient Name: Date of Service: Yesenia Kramer, BRODA 06/27/2022 9:45 A M Medical Record Number: [id number] Patient Account Number: [id number] Date of Birth/Sex: Treating RN: 1941/07/24 (81 y.o. Yesenia Kramer Primary Care Shailen Thielen: Donald Prose Other Clinician: Referring Caley Ciaramitaro: Treating Lashante Fryberger/Extender: Barnie Alderman Weeks in Treatment: 0 Allergies Active Allergies simvastatin Reaction: "felt bad" Allergy Notes Electronic Signature(s) Signed: 06/28/2022 5:16:03 PM By: Deon Pilling RN, BSN Entered By: Deon Pilling on 06/26/2022 15:29:00 -------------------------------------------------------------------------------- Arrival Information Details Patient Name: Date of Service: Yesenia Kramer. 06/27/2022 9:45 A M Medical Record Number: [id number] Patient Account Number: [id number] Date of Birth/Sex: Treating RN: 12-15-1940 (81 y.o. F) Primary Care Meaghann Choo: Donald Prose Other Clinician: Referring Adna Nofziger: Treating Elaynah Virginia/Extender: Barnie Alderman Weeks in Treatment: 0 Visit Information Patient Arrived: Ambulatory Arrival Time: 09:41 Accompanied By: daughter Transfer Assistance: None Patient Identification Verified: Yes Secondary Verification Process Completed: Yes Patient Requires Transmission-Based Precautions: No Patient Has Alerts: Yes Patient Alerts: DO NOT USE LEFT ARM Electronic Signature(s) Signed: 06/27/2022 4:20:32 PM By: Erenest Blank Entered By: Erenest Blank on 06/27/2022 10:00:07 -------------------------------------------------------------------------------- Clinic Level of Care Assessment Details Patient Name: Date of Service: Kramer, Yesenia 06/27/2022 9:45 A M Medical Record Number: [id number] Patient Account Number: [id number] Date of Birth/Sex: Treating RN: 05-26-1941 (81 y.o. Yesenia Kramer Primary Care Xena Propst: Donald Prose Other  Clinician: Referring Denaisha Swango: Treating Josely Moffat/Extender: Barnie Alderman Weeks in Treatment: 0 Clinic Level of Care Assessment Items TOOL 1 Quantity Score X- 1 0 Use when EandM and Procedure is performed on INITIAL visit ASSESSMENTS - Nursing Assessment / Reassessment X- 1 20 General Physical Exam (combine w/ comprehensive assessment (listed just below) when performed on new pt. evals) X- 1 25 Comprehensive Assessment (HX, ROS, Risk Assessments, Wounds Hx, etc.) ASSESSMENTS - Wound and Skin Assessment / Reassessment X- 1 10 Dermatologic / Skin Assessment (not related to wound area) ASSESSMENTS - Ostomy and/or Continence Assessment and Care []  - 0 Incontinence Assessment and Management []  - 0 Ostomy Care Assessment and Management (repouching, etc.) PROCESS - Coordination of Care X - Simple Patient / Family Education for ongoing care 1 15 []  - 0 Complex (extensive) Patient / Family Education for ongoing care X- 1 10 Staff obtains Programmer, systems, Records, T Results / Process Orders est []  - 0 Staff telephones HHA, Nursing Homes / Clarify orders / etc []  - 0 Routine Transfer to another Facility (non-emergent condition) []  - 0 Routine Hospital Admission (non-emergent condition) X- 1 15 New Admissions / Biomedical engineer / Ordering NPWT Apligraf, etc. , []  - 0 Emergency Hospital Admission (emergent condition) PROCESS - Special Needs []  - 0 Pediatric / Minor Patient Management []  - 0 Isolation Patient Management []  - 0 Hearing / Language / Visual special needs []  - 0 Assessment of Community assistance (transportation, D/C planning, etc.) []  - 0 Additional assistance / Altered mentation []  - 0 Support Surface(s) Assessment (bed, cushion, seat, etc.) INTERVENTIONS - Miscellaneous []  - 0 External ear exam []  - 0 Patient Transfer (multiple staff / Civil Service fast streamer / Similar devices) []  - 0 Simple Staple / Suture removal (25 or less) []  - 0 Complex Staple /  Suture removal (26 or more) []  - 0 Hypo/Hyperglycemic Management (do not check if billed separately) X- 1 15 Ankle / Brachial Index (ABI) - do not check if billed separately Has the patient been seen at the hospital within the last three years:  Yes Total Score: 110 Level Of Care: New/Established - Level 3 Electronic Signature(s) Signed: 06/28/2022 5:16:03 PM By: Deon Pilling RN, BSN Entered By: Deon Pilling on 06/27/2022 11:00:56 -------------------------------------------------------------------------------- Encounter Discharge Information Details Patient Name: Date of Service: Yesenia Kramer. 06/27/2022 9:45 A M Medical Record Number: [id number] Patient Account Number: [id number] Date of Birth/Sex: Treating RN: [birth year] (81 y.o. Yesenia Kramer Primary Care Litisha Guagliardo: Donald Prose Other Clinician: Referring Krystie Leiter: Treating Gudelia Eugene/Extender: Barnie Alderman Weeks in Treatment: 0 Encounter Discharge Information Items Post Procedure Vitals Discharge Condition: Stable Temperature (F): 98.3 Ambulatory Status: Ambulatory Pulse (bpm): 60 Discharge Destination: Home Respiratory Rate (breaths/min): 18 Transportation: Private Auto Blood Pressure (mmHg): 144/86 Accompanied By: daughter Schedule Follow-up Appointment: Yes Clinical Summary of Care: Electronic Signature(s) Signed: 06/28/2022 5:16:03 PM By: Deon Pilling RN, BSN Entered By: Deon Pilling on 06/27/2022 11:01:40 -------------------------------------------------------------------------------- Lower Extremity Assessment Details Patient Name: Date of Service: Yesenia Kramer. 06/27/2022 9:45 A M Medical Record Number: [id number] Patient Account Number: [id number] Date of Birth/Sex: Treating RN: [birth year] (81 y.o. Yesenia Kramer, Tammi Klippel Primary Care Analisia Kingsford: Donald Prose Other Clinician: Referring Kagan Hietpas: Treating Job Holtsclaw/Extender: Barnie Alderman Weeks in Treatment: 0 Edema  Assessment Assessed: [Left: No] [Right: Yes] Edema: [Left: Ye] [Right: s] Calf Left: Right: Point of Measurement: 42 cm From Medial Instep 40.5 cm Ankle Left: Right: Point of Measurement: 10 cm From Medial Instep 24 cm Knee To Floor Left: Right: From Medial Instep 49 cm Vascular Assessment Pulses: Dorsalis Pedis Palpable: [Right:Yes] Posterior Tibial Palpable: [Right:Yes] Blood Pressure: Brachial: [Right:144] Ankle: [Right:Dorsalis Pedis: 160] Ankle Brachial Index: [Right:1.11] Electronic Signature(s) Signed: 06/28/2022 5:16:03 PM By: Deon Pilling RN, BSN Entered By: Deon Pilling on 06/27/2022 10:13:23 -------------------------------------------------------------------------------- Multi-Disciplinary Care Plan Details Patient Name: Date of Service: Yesenia Kramer. 06/27/2022 9:45 A M Medical Record Number: [id number] Patient Account Number: [id number] Date of Birth/Sex: Treating RN: 1941-09-19 (81 y.o. Yesenia Kramer, Tammi Klippel Primary Care Ylianna Almanzar: Donald Prose Other Clinician: Referring Allen Egerton: Treating Wildon Cuevas/Extender: Barnie Alderman Weeks in Treatment: 0 Active Inactive Orientation to the Wound Care Program Nursing Diagnoses: Knowledge deficit related to the wound healing center program Goals: Patient/caregiver will verbalize understanding of the [facility] Program Date Initiated: 06/27/2022 Target Resolution Date: 07/26/2022 Goal Status: Active Interventions: Provide education on orientation to the wound center Notes: Pain, Acute or Chronic Nursing Diagnoses: Pain, acute or chronic: actual or potential Potential alteration in comfort, pain Goals: Patient will verbalize adequate pain control and receive pain control interventions during procedures as needed Date Initiated: 06/27/2022 Target Resolution Date: 07/27/2022 Goal Status: Active Patient/caregiver will verbalize comfort level met Date Initiated: 06/27/2022 Target Resolution Date:  07/26/2022 Goal Status: Active Interventions: Encourage patient to take pain medications as prescribed Provide education on pain management Reposition patient for comfort Treatment Activities: Administer pain control measures as ordered : 06/27/2022 Notes: Wound/Skin Impairment Nursing Diagnoses: Knowledge deficit related to ulceration/compromised skin integrity Goals: Patient/caregiver will verbalize understanding of skin care regimen Date Initiated: 06/27/2022 Target Resolution Date: 07/27/2022 Goal Status: Active Interventions: Assess patient/caregiver ability to perform ulcer/skin care regimen upon admission and as needed Assess ulceration(s) every visit Provide education on ulcer and skin care Treatment Activities: Skin care regimen initiated : 06/27/2022 Topical wound management initiated : 06/27/2022 Notes: Electronic Signature(s) Signed: 06/28/2022 5:16:03 PM By: Deon Pilling RN, BSN Entered By: Deon Pilling on 06/27/2022 10:37:28 -------------------------------------------------------------------------------- Pain Assessment Details Patient Name: Date of Service: Monia Sabal NIE L. 06/27/2022 9:45 A M Medical Record Number:  [id number] Patient Account Number: [id number] Date of Birth/Sex: Treating RN: [birth year] (81 y.o. Yesenia Kramer Primary Care Jaylissa Felty: Donald Prose Other Clinician: Referring Dim Meisinger: Treating Tramaine Sauls/Extender: Barnie Alderman Weeks in Treatment: 0 Active Problems Location of Pain Severity and Description of Pain Patient Has Paino No Site Locations Rate the pain. Current Pain Level: 0 Pain Management and Medication Current Pain Management: Notes Per patient more itching and at times burning currently 0/10 pain. Electronic Signature(s) Signed: 06/28/2022 5:16:03 PM By: Deon Pilling RN, BSN Entered By: Deon Pilling on 06/27/2022  10:16:05 -------------------------------------------------------------------------------- Patient/Caregiver Education Details Patient Name: Date of Service: Yesenia Kramer 10/4/2023andnbsp9:45 [place] Record Number: [id number] Patient Account Number: [id number] Date of Birth/Gender: Treating RN: 1941/08/24 (81 y.o. Yesenia Kramer Primary Care Physician: Donald Prose Other Clinician: Referring Physician: Treating Physician/Extender: Barnie Alderman Weeks in Treatment: 0 Education Assessment Education Provided To: Patient Education Topics Provided [place]: o Handouts: Welcome T [facility] [place] o Methods: Explain/Verbal Responses: Reinforcements needed Electronic Signature(s) Signed: 06/28/2022 5:16:03 PM By: Deon Pilling RN, BSN Entered By: Deon Pilling on 06/27/2022 10:37:38 -------------------------------------------------------------------------------- Wound Assessment Details Patient Name: Date of Service: Yesenia Kramer. 06/27/2022 9:45 A M Medical Record Number: [id number] Patient Account Number: [id number] Date of Birth/Sex: Treating RN: Dec 12, 1940 (81 y.o. Yesenia Kramer, Tammi Klippel Primary Care Lainie Daubert: Donald Prose Other Clinician: Referring Almedia Cordell: Treating Mireille Lacombe/Extender: Barnie Alderman Weeks in Treatment: 0 Wound Status Wound Number: 1 Primary Etiology: Lymphedema Wound Location: Right, Anterior Lower Leg Secondary Etiology: Abrasion Wounding Event: Trauma Wound Status: Open Date Acquired: 04/05/2022 Comorbid History: Hypertension Weeks Of Treatment: 0 Clustered Wound: No Photos Wound Measurements Length: (cm) 2 Width: (cm) 0.3 Depth: (cm) 0.1 Area: (cm) 0.471 Volume: (cm) 0.047 % Reduction in Area: % Reduction in Volume: Epithelialization: Small (1-33%) Tunneling: No Undermining: No Wound Description Classification: Unclassifiable Wound Margin: Distinct, outline attached Exudate  Amount: Medium Exudate Type: Serosanguineous Exudate Color: red, brown Foul Odor After Cleansing: No Slough/Fibrino Yes Wound Bed Granulation Amount: None Present (0%) Exposed Structure Necrotic Amount: Large (67-100%) Fascia Exposed: No Necrotic Quality: Adherent Slough Fat Layer (Subcutaneous Tissue) Exposed: No Tendon Exposed: No Muscle Exposed: No Joint Exposed: No Bone Exposed: No Periwound Skin Texture Texture Color No Abnormalities Noted: No No Abnormalities Noted: No Callus: No Atrophie Blanche: No Crepitus: No Cyanosis: No Excoriation: No Ecchymosis: No Induration: No Erythema: Yes Rash: Yes Erythema Location: Circumferential Scarring: No Hemosiderin Staining: Yes Mottled: No Moisture Pallor: No No Abnormalities Noted: No Rubor: No Dry / Scaly: Yes Maceration: No Temperature / Pain Temperature: Hot Tenderness on Palpation: Yes Assessment Notes Itching and burning to Periwound. Treatment Notes Wound #1 (Lower Leg) Wound Laterality: Right, Anterior Cleanser Soap and Water Discharge Instruction: May shower and wash wound with dial antibacterial soap and water prior to dressing change. Peri-Wound Care Ketoconazole Cream 2% Discharge Instruction: Apply Ketoconazole ****ONLY IN CLINIC.*** Nystatin ointment Discharge Instruction: pick up at pharmacy. apply daily to right leg and wound bed. Topical Primary Dressing Hydrofera Blue Ready Foam, 4x5 in Discharge Instruction: Apply to wound bed as instructed Secondary Dressing ABD Pad, 8x10 Discharge Instruction: Apply over primary dressing as directed. Secured With [employer], 4.5x3.1 (in/yd) Discharge Instruction: Secure with Kerlix as directed. 38M Medipore H Soft Cloth Surgical T ape, 4 x 10 (in/yd) Discharge Instruction: Secure with tape as directed. tubigrip size E Discharge Instruction: apply over the dressing to right leg- 1 layer. Compression Wrap  Compression  Stockings Add-Ons Electronic Signature(s) Signed: 06/28/2022 5:16:03 PM By: Deon Pilling RN, BSN Entered By: Deon Pilling on 06/27/2022 10:07:32 -------------------------------------------------------------------------------- Vitals Details Patient Name: Date of Service: Yesenia Kramer. 06/27/2022 9:45 A M Medical Record Number: [id number] Patient Account Number: [id number] Date of Birth/Sex: Treating RN: July 02, 1941 (81 y.o. F) Primary Care Alleyne Lac: Donald Prose Other Clinician: Referring Ila Landowski: Treating Wiliam Cauthorn/Extender: Barnie Alderman Weeks in Treatment: 0 Vital Signs Time Taken: 09:55 Temperature (F): 98.3 Height (in): 65 Pulse (bpm): 60 Source: Stated Respiratory Rate (breaths/min): 18 Weight (lbs): 217 Blood Pressure (mmHg): 144/86 Source: Stated Reference Range: 80 - 120 mg / dl Body Mass Index (BMI): 36.1 Electronic Signature(s) Signed: 06/27/2022 4:20:32 PM By: Erenest Blank Entered By: Erenest Blank on 06/27/2022 09:59:37

## 2022-07-04 ENCOUNTER — Encounter (HOSPITAL_BASED_OUTPATIENT_CLINIC_OR_DEPARTMENT_OTHER): Payer: Medicare Other | Admitting: Physician Assistant

## 2022-07-04 DIAGNOSIS — Z8673 Personal history of transient ischemic attack (TIA), and cerebral infarction without residual deficits: Secondary | ICD-10-CM | POA: Diagnosis not present

## 2022-07-04 DIAGNOSIS — L97812 Non-pressure chronic ulcer of other part of right lower leg with fat layer exposed: Secondary | ICD-10-CM | POA: Diagnosis not present

## 2022-07-04 DIAGNOSIS — I10 Essential (primary) hypertension: Secondary | ICD-10-CM | POA: Diagnosis not present

## 2022-07-04 DIAGNOSIS — B354 Tinea corporis: Secondary | ICD-10-CM | POA: Diagnosis not present

## 2022-07-04 DIAGNOSIS — I890 Lymphedema, not elsewhere classified: Secondary | ICD-10-CM | POA: Diagnosis not present

## 2022-07-04 NOTE — Progress Notes (Signed)
AMARACHUKWU, LAKATOS ([id number]) [id number] Page 1 of 8 Visit Report for 07/04/2022 Arrival Information Details Patient Name: Date of Service: Yesenia Kramer, Yesenia Kramer 07/04/2022 2:15 PM Medical Record Number: [id number] Patient Account Number: [id number] Date of Birth/Sex: Treating RN: 1940-12-04 (81 y.o. Helene Shoe, Tammi Klippel Primary Care Irisa Grimsley: Donald Prose Other Clinician: Referring Anniston Nellums: Treating Silus Lanzo/Extender: Barnie Alderman Weeks in Treatment: 1 Visit Information History Since Last Visit Added or deleted any medications: No Patient Arrived: Ambulatory Any new allergies or adverse reactions: No Arrival Time: 14:21 Had a fall or experienced change in No Accompanied By: daughter activities of daily living that may affect Transfer Assistance: None risk of falls: Patient Identification Verified: Yes Signs or symptoms of abuse/neglect since last visito No Secondary Verification Process Completed: Yes Hospitalized since last visit: No Patient Requires Transmission-Based No Implantable device outside of the clinic excluding No Precautions: cellular tissue based products placed in the center Patient Has Alerts: Yes since last visit: Patient Alerts: DO NOT USE LEFT ARM Has Dressing in Place as Prescribed: Yes Pain Present Now: No Electronic Signature(s) Signed: 07/04/2022 4:39:54 PM By: Erenest Blank Entered By: Erenest Blank on 07/04/2022 14:22:48 -------------------------------------------------------------------------------- Clinic Level of Care Assessment Details Patient Name: Date of Service: Yesenia Kramer, Yesenia Kramer 07/04/2022 2:15 PM Medical Record Number: [id number] Patient Account Number: [id number] Date of Birth/Sex: Treating RN: Jun 01, 1941 (81 y.o. [name] Primary Care Gayla Benn: Donald Prose Other Clinician: Referring Tikia Skilton: Treating Mirielle Byrum/Extender: Barnie Alderman Weeks in Treatment: 1 Clinic Level of Care  Assessment Items TOOL 4 Quantity Score X- 1 0 Use when only an EandM is performed on FOLLOW-UP visit ASSESSMENTS - Nursing Assessment / Reassessment X- 1 10 Reassessment of Co-morbidities (includes updates in patient status) X- 1 5 Reassessment of Adherence to Treatment Plan ASSESSMENTS - Wound and Skin A ssessment / Reassessment X - Simple Wound Assessment / Reassessment - one wound 1 5 []  - 0 Complex Wound Assessment / Reassessment - multiple wounds X- 1 10 Dermatologic / Skin Assessment (not related to wound area) ASSESSMENTS - Focused Assessment X- 1 5 Circumferential Edema Measurements - multi extremities []  - 0 Nutritional Assessment / Counseling / Intervention Yesenia Kramer, Yesenia Kramer ([id number]) [id number] Page 2 of 8 []  - 0 Lower Extremity Assessment (monofilament, tuning fork, pulses) []  - 0 Peripheral Arterial Disease Assessment (using hand held doppler) ASSESSMENTS - Ostomy and/or Continence Assessment and Care []  - 0 Incontinence Assessment and Management []  - 0 Ostomy Care Assessment and Management (repouching, etc.) PROCESS - Coordination of Care X - Simple Patient / Family Education for ongoing care 1 15 []  - 0 Complex (extensive) Patient / Family Education for ongoing care X- 1 10 Staff obtains Programmer, systems, Records, T Results / Process Orders est []  - 0 Staff telephones HHA, Nursing Homes / Clarify orders / etc []  - 0 Routine Transfer to another Facility (non-emergent condition) []  - 0 Routine Hospital Admission (non-emergent condition) []  - 0 New Admissions / Biomedical engineer / Ordering NPWT Apligraf, etc. , []  - 0 Emergency Hospital Admission (emergent condition) X- 1 10 Simple Discharge Coordination []  - 0 Complex (extensive) Discharge Coordination PROCESS - Special Needs []  - 0 Pediatric / Minor Patient Management []  - 0 Isolation Patient Management []  - 0 Hearing / Language / Visual special needs []  - 0 Assessment  of Community assistance (transportation, D/C planning, etc.) []  - 0 Additional assistance / Altered mentation []  - 0 Support Surface(s) Assessment (bed, cushion, seat, etc.) INTERVENTIONS - Wound Cleansing /  Measurement X - Simple Wound Cleansing - one wound 1 5 $R'[]'is$  - 0 Complex Wound Cleansing - multiple wounds X- 1 5 Wound Imaging (photographs - any number of wounds) $RemoveBe'[]'itJobGtco$  - 0 Wound Tracing (instead of photographs) X- 1 5 Simple Wound Measurement - one wound $RemoveB'[]'EmEmludw$  - 0 Complex Wound Measurement - multiple wounds INTERVENTIONS - Wound Dressings X - Small Wound Dressing one or multiple wounds 1 10 $Re'[]'Pyc$  - 0 Medium Wound Dressing one or multiple wounds $RemoveBeforeD'[]'UUmrAhvCgsOuhK$  - 0 Large Wound Dressing one or multiple wounds $RemoveBeforeD'[]'FGiXLKvUECTtkn$  - 0 Application of Medications - topical $RemoveB'[]'dZAUplRU$  - 0 Application of Medications - injection INTERVENTIONS - Miscellaneous $RemoveBeforeD'[]'cUfFxXEsIIZnOq$  - 0 External ear exam $Remove'[]'XRlJjIR$  - 0 Specimen Collection (cultures, biopsies, blood, body fluids, etc.) $RemoveBefor'[]'DXNCFWmxJMFJ$  - 0 Specimen(s) / Culture(s) sent or taken to Lab for analysis $RemoveBefo'[]'gCSBTitzgbz$  - 0 Patient Transfer (multiple staff / Civil Service fast streamer / Similar devices) $RemoveBeforeDE'[]'ffFrHMcEbZMnhDx$  - 0 Simple Staple / Suture removal (25 or less) $Remove'[]'eNDZlHX$  - 0 Complex Staple / Suture removal (26 or more) $Remove'[]'iXuUuzv$  - 0 Hypo / Hyperglycemic Management (close monitor of Blood Glucose) Yesenia Kramer, Yesenia Kramer ([id number]) [id number] Page 3 of 8 $Re'[]'TlG$  - 0 Ankle / Brachial Index (ABI) - do not check if billed separately X- 1 5 Vital Signs Has the patient been seen at the hospital within the last three years: Yes Total Score: 100 Level Of Care: New/Established - Level 3 Electronic Signature(s) Signed: 07/04/2022 6:11:16 PM By: Deon Pilling RN, BSN Entered By: Deon Pilling on 07/04/2022 15:03:43 -------------------------------------------------------------------------------- Encounter Discharge Information Details Patient Name: Date of Service: Yesenia Hacker. 07/04/2022 2:15 PM Medical Record Number: [id number] Patient  Account Number: [id number] Date of Birth/Sex: Treating RN: 1941/02/09 (81 y.o. [name] Primary Care Mickaela Starlin: Donald Prose Other Clinician: Referring Jakory Matsuo: Treating Lucus Lambertson/Extender: Barnie Alderman Weeks in Treatment: 1 Encounter Discharge Information Items Discharge Condition: Stable Ambulatory Status: Ambulatory Discharge Destination: Home Transportation: Private Auto Accompanied By: daughter Schedule Follow-up Appointment: Yes Clinical Summary of Care: Electronic Signature(s) Signed: 07/04/2022 6:11:16 PM By: Deon Pilling RN, BSN Entered By: Deon Pilling on 07/04/2022 15:05:35 -------------------------------------------------------------------------------- Lower Extremity Assessment Details Patient Name: Date of Service: Yesenia Hacker. 07/04/2022 2:15 PM Medical Record Number: [id number] Patient Account Number: [id number] Date of Birth/Sex: Treating RN: September 13, 1941 (81 y.o. [name] Primary Care Aiza Vollrath: Donald Prose Other Clinician: Referring Montague Corella: Treating Takya Vandivier/Extender: Barnie Alderman Weeks in Treatment: 1 Edema Assessment Assessed: [Left: No] [Right: No] Edema: [Left: Ye] [Right: s] Calf Left: Right: Point of Measurement: 42 cm From Medial Instep 42 cm Ankle Left: Right: Point of Measurement: 10 cm From Medial Instep 24 cm Vascular Assessment Yesenia Kramer, Yesenia Kramer ([id number]) [[id number] Page 4 of 8] Pulses: Dorsalis Pedis Palpable: [Right:Yes] Electronic Signature(s) Signed: 07/04/2022 4:39:54 PM By: Erenest Blank Signed: 07/04/2022 6:11:16 PM By: Deon Pilling RN, BSN Entered By: Erenest Blank on 07/04/2022 14:30:41 -------------------------------------------------------------------------------- Multi-Disciplinary Care Plan Details Patient Name: Date of Service: Monia Sabal Yesenia Kramer 07/04/2022 2:15 PM Medical Record Number: [id number] Patient Account Number: [id number] Date of  Birth/Sex: Treating RN: 08-Sep-1941 (81 y.o. [name] Primary Care Mahesh Sizemore: Donald Prose Other Clinician: Referring Maelynn Moroney: Treating Cedarius Kersh/Extender: Barnie Alderman Weeks in Treatment: 1 Active Inactive Pain, Acute or Chronic Nursing Diagnoses: Pain, acute or chronic: actual or potential Potential alteration in comfort, pain Goals: Patient will verbalize adequate pain control and receive pain control interventions during procedures as needed Date Initiated: 06/27/2022 Target Resolution Date: 07/27/2022 Goal Status: Active Patient/caregiver  will verbalize comfort level met Date Initiated: 06/27/2022 Target Resolution Date: 07/26/2022 Goal Status: Active Interventions: Encourage patient to take pain medications as prescribed Provide education on pain management Reposition patient for comfort Treatment Activities: Administer pain control measures as ordered : 06/27/2022 Notes: Wound/Skin Impairment Nursing Diagnoses: Knowledge deficit related to ulceration/compromised skin integrity Goals: Patient/caregiver will verbalize understanding of skin care regimen Date Initiated: 06/27/2022 Target Resolution Date: 07/27/2022 Goal Status: Active Interventions: Assess patient/caregiver ability to perform ulcer/skin care regimen upon admission and as needed Assess ulceration(s) every visit Provide education on ulcer and skin care Treatment Activities: Skin care regimen initiated : 06/27/2022 Topical wound management initiated : 06/27/2022 Notes: TED, LEONHART ([id number]) [id number] Page 5 of 8 Electronic Signature(s) Signed: 07/04/2022 6:11:16 PM By: Deon Pilling RN, BSN Entered By: Deon Pilling on 07/04/2022 14:50:55 -------------------------------------------------------------------------------- Pain Assessment Details Patient Name: Date of Service: Yesenia Kramer, Yesenia Kramer 07/04/2022 2:15 PM Medical Record Number: [id number] Patient Account  Number: [id number] Date of Birth/Sex: Treating RN: 06/10/1941 (81 y.o. [name] Primary Care Vernon Ariel: Donald Prose Other Clinician: Referring Shaylon Gillean: Treating Yesenia Kramer: Barnie Alderman Weeks in Treatment: 1 Active Problems Location of Pain Severity and Description of Pain Patient Has Paino No Site Locations Pain Management and Medication Current Pain Management: Electronic Signature(s) Signed: 07/04/2022 4:39:54 PM By: Erenest Blank Signed: 07/04/2022 6:11:16 PM By: Deon Pilling RN, BSN Entered By: Erenest Blank on 07/04/2022 14:23:48 -------------------------------------------------------------------------------- Patient/Caregiver Education Details Patient Name: Date of Service: Yesenia Hacker 10/11/2023andnbsp2:15 PM Medical Record Number: [id number] Patient Account Number: [id number] Date of Birth/Gender: Treating RN: 09/08/1941 (81 y.o. [name] Primary Care Physician: Donald Prose Other Clinician: Referring Physician: Treating Physician/Extender: Barnie Alderman Weeks in Treatment: 1 Education Assessment Education Provided To: Yesenia Kramer, Yesenia Kramer ([id number]) [id number] Page 6 of 8 Patient Education Topics Provided Electronic Signature(s) Signed: 07/04/2022 6:11:16 PM By: Deon Pilling RN, BSN Entered By: Deon Pilling on 07/04/2022 14:51:04 -------------------------------------------------------------------------------- Wound Assessment Details Patient Name: Date of Service: Yesenia Hacker. 07/04/2022 2:15 PM Medical Record Number: [id number] Patient Account Number: [id number] Date of Birth/Sex: Treating RN: 07/21/1941 (81 y.o. Helene Shoe, Tammi Klippel Primary Care Mahathi Pokorney: Donald Prose Other Clinician: Referring Erion Weightman: Treating Yesenia Kramer/Extender: Barnie Alderman Weeks in Treatment: 1 Wound Status Wound Number: 1 Primary Etiology: Lymphedema Wound Location: Right, Anterior Lower  Leg Secondary Etiology: Abrasion Wounding Event: Trauma Wound Status: Open Date Acquired: 04/05/2022 Comorbid History: Cataracts, Anemia, Hypertension Weeks Of Treatment: 1 Clustered Wound: No Photos Wound Measurements Length: (cm) 0.8 Width: (cm) 0.4 Depth: (cm) 0.1 Area: (cm) 0.251 Volume: (cm) 0.025 % Reduction in Area: 46.7% % Reduction in Volume: 46.8% Epithelialization: Large (67-100%) Tunneling: No Undermining: No Wound Description Classification: Full Thickness Without Exposed Suppo Wound Margin: Distinct, outline attached Exudate Amount: Medium Exudate Type: Serosanguineous Exudate Color: red, brown rt Structures Foul Odor After Cleansing: No Slough/Fibrino Yes Wound Bed Granulation Amount: Medium (34-66%) Exposed Structure Granulation Quality: Red Fascia Exposed: No Necrotic Amount: Medium (34-66%) Fat Layer (Subcutaneous Tissue) Exposed: Yes Necrotic Quality: Adherent Slough Tendon Exposed: No Muscle Exposed: No Joint Exposed: No Bone Exposed: No Periwound Skin Texture Texture Color No Abnormalities Noted: No No Abnormalities Noted: No Yesenia Kramer, Yesenia Kramer ([id number]) [id number] Page 7 of 8 Callus: No Atrophie Blanche: No Crepitus: No Cyanosis: No Excoriation: No Ecchymosis: No Induration: No Erythema: Yes Rash: Yes Erythema Location: Circumferential Scarring: No Erythema Change: Decreased Hemosiderin Staining: Yes Moisture Mottled: No No Abnormalities Noted: No Pallor: No Dry / Scaly:  Yes Rubor: No Maceration: No Temperature / Pain Temperature: No Abnormality Tenderness on Palpation: Yes Treatment Notes Wound #1 (Lower Leg) Wound Laterality: Right, Anterior Cleanser Soap and Water Discharge Instruction: May shower and wash wound with dial antibacterial soap and water prior to dressing change. Peri-Wound Care Ketoconazole Cream 2% Discharge Instruction: Apply Ketoconazole ****ONLY IN CLINIC.*** Nystatin  ointment Discharge Instruction: apply daily to right leg and wound bed. Topical Primary Dressing Hydrofera Blue Ready Foam, 4x5 in Discharge Instruction: Apply to wound bed as instructed Secondary Dressing ABD Pad, 8x10 Discharge Instruction: Apply over primary dressing as directed. Secured With [employer], 4.5x3.1 (in/yd) Discharge Instruction: Secure with Kerlix as directed. 47M Medipore H Soft Cloth Surgical T ape, 4 x 10 (in/yd) Discharge Instruction: Secure with tape as directed. tubigrip size E Discharge Instruction: apply over the dressing to right leg- 1 layer. Compression Wrap Compression Stockings Add-Ons Electronic Signature(s) Signed: 07/04/2022 6:11:16 PM By: Deon Pilling RN, BSN Entered By: Deon Pilling on 07/04/2022 15:00:01 -------------------------------------------------------------------------------- Vitals Details Patient Name: Date of Service: Yesenia Hacker. 07/04/2022 2:15 PM Medical Record Number: [id number] Patient Account Number: [id number] Date of Birth/Sex: Treating RN: Dec 15, 1940 (81 y.o. [name] Primary Care Harlo Jaso: Donald Prose Other Clinician: Referring Sonora Catlin: Treating Favian Kittleson/Extender: Whitman Hero, Gari Crown Weeks in Treatment: 1 Vital Signs NORETTA, FRIER ([id number]) [id number] Page 8 of 8 Time Taken: 14:22 Temperature (F): 98.4 Height (in): 65 Pulse (bpm): 83 Weight (lbs): 217 Respiratory Rate (breaths/min): 18 Body Mass Index (BMI): 36.1 Blood Pressure (mmHg): 154/79 Reference Range: 80 - 120 mg / dl Electronic Signature(s) Signed: 07/04/2022 4:39:54 PM By: Erenest Blank Entered By: Erenest Blank on 07/04/2022 14:23:40

## 2022-07-04 NOTE — Progress Notes (Addendum)
JAZELLE, ACHEY ([id number]) [id number] Page 1 of 6 Visit Report for 07/04/2022 Chief Complaint Document Details Patient Name: Date of Service: Yesenia Kramer, Yesenia Kramer 07/04/2022 2:15 PM Medical Record Number: [id number] Patient Account Number: [id number] Date of Birth/Sex: Treating RN: 12-27-1940 (81 y.o. [name] Primary Care Provider: Donald Prose Other Clinician: Referring Provider: Treating Provider/Extender: Barnie Alderman Weeks in Treatment: 1 Information Obtained from: Patient Chief Complaint Right LE Ulcer Electronic Signature(s) Signed: 07/04/2022 2:46:30 PM By: Worthy Keeler PA-C Entered By: Worthy Keeler on 07/04/2022 14:46:30 -------------------------------------------------------------------------------- HPI Details Patient Name: Date of Service: Yesenia Kramer. 07/04/2022 2:15 PM Medical Record Number: [id number] Patient Account Number: [id number] Date of Birth/Sex: Treating RN: 1941-03-15 (81 y.o. [name] Primary Care Provider: Donald Prose Other Clinician: Referring Provider: Treating Provider/Extender: Barnie Alderman Weeks in Treatment: 1 History of Present Illness HPI Description: 06-27-2022 upon evaluation patient presents for initial evaluation here in the clinic concerning a wound over the anterior shin of the right lower extremity which occurred when she fell in the shower July 2023. She tells me that since that time she has been having a lot of issues here with getting this to heal. She tells me that initially she seemed to do decently well with regard to healing and so she ended up using a DuoDERM dressing that she purchased over- the-counter which subsequently led to increased pain as far as the wound itself was concerned. Following this she states that she developed a rash both around the wound which was painful as well as a rash more recently on her torso and upper body as well. I am unsure if  that upper body rash is related or separate completely from what is going on around her leg. The region around the leg appears to be potentially somewhat fungal. Patient was seen by her primary care provider on Monday of this week just 2 days ago and was given oral steroids as well as Keflex and seems to be doing somewhat better based on what she tells me today. This is actually really good news. I do not see any evidence of active worsening infection at this time. With that being said I do think there is a need for her to continue the Keflex as there is definitely some signs of potential cellulitis also believe there could be some fungal infection going on around the region of the wound in general. Patient does have a history of hypertension, TIAs, and lower extremity bilateral lymphedema. 07-04-2022 upon evaluation today patient appears to be doing well currently in regard to her wound. She is actually been tolerating the dressing changes without complication and the rash around the leg as well as the wound itself both appear to be doing significantly better which is great news. Fortunately I see no signs of infection locally or systemically at this time. Electronic Signature(s) Signed: 07/04/2022 3:06:40 PM By: Worthy Keeler PA-C Entered By: Worthy Keeler on 07/04/2022 15:06:40 Bonnita Levan ([id number]) [id number] Page 2 of 6 -------------------------------------------------------------------------------- Physical Exam Details Patient Name: Date of Service: Yesenia Kramer 07/04/2022 2:15 PM Medical Record Number: [id number] Patient Account Number: [id number] Date of Birth/Sex: Treating RN: [birth year] (81 y.o. [name] Primary Care Provider: Donald Prose Other Clinician: Referring Provider: Treating Provider/Extender: Barnie Alderman Weeks in Treatment: 1 Constitutional Well-nourished and well-hydrated in no acute  distress. Respiratory normal breathing without difficulty. Psychiatric this patient is able to make  decisions and demonstrates good insight into disease process. Alert and Oriented x 3. pleasant and cooperative. Notes Upon inspection patient's wound bed showed evidence of good granulation and epithelization at this point. Fortunately I do not see any signs of active infection locally or systemically which is great news and overall I am extremely pleased with where things stand. No fevers, chills, nausea, vomiting, or diarrhea. Electronic Signature(s) Signed: 07/04/2022 3:06:57 PM By: Worthy Keeler PA-C Entered By: Worthy Keeler on 07/04/2022 15:06:57 -------------------------------------------------------------------------------- Physician Orders Details Patient Name: Date of Service: Yesenia Kramer 07/04/2022 2:15 PM Medical Record Number: [id number] Patient Account Number: [id number] Date of Birth/Sex: Treating RN: 1941-01-11 (81 y.o. Helene Shoe, Tammi Klippel Primary Care Provider: Donald Prose Other Clinician: Referring Provider: Treating Provider/Extender: Barnie Alderman Weeks in Treatment: 1 Verbal / Phone Orders: No Diagnosis Coding ICD-10 Coding Code Description I89.0 Lymphedema, not elsewhere classified B35.4 Tinea corporis L97.812 Non-pressure chronic ulcer of other part of right lower leg with fat layer exposed I10 Essential (primary) hypertension Z86.73 Personal history of transient ischemic attack (TIA), and cerebral infarction without residual deficits Follow-up Appointments ppointment in 2 weeks. Jeri Cos, PA and [place], Room 8 Return A Anesthetic (In clinic) Topical Lidocaine 5% applied to wound bed Bathing/ Shower/ Hygiene May shower and wash wound with soap and water. - with dressing changes. Edema Control - Lymphedema / SCD / Other Elevate legs to the level of the heart or above for 30 minutes daily and/or when sitting, a frequency of: - 3-4 times  a day throughout the day. Avoid standing for long periods of time. Exercise regularly [name], Kramer ([id number]) [id number] Page 3 of 6 Wound Treatment Wound #1 - Lower Leg Wound Laterality: Right, Anterior Cleanser: Soap and Water 1 x Per Day/30 Days Discharge Instructions: May shower and wash wound with dial antibacterial soap and water prior to dressing change. Peri-Wound Care: Ketoconazole Cream 2% 1 x Per Day/30 Days Discharge Instructions: Apply Ketoconazole ****ONLY IN CLINIC.*** Peri-Wound Care: Nystatin ointment 1 x Per Day/30 Days Discharge Instructions: apply daily to right leg and wound bed. Prim Dressing: Hydrofera Blue Ready Foam, 4x5 in 1 x Per Day/30 Days ary Discharge Instructions: Apply to wound bed as instructed Secondary Dressing: ABD Pad, 8x10 1 x Per Day/30 Days Discharge Instructions: Apply over primary dressing as directed. Secured With: [employer], 4.5x3.1 (in/yd) 1 x Per Day/30 Days Discharge Instructions: Secure with Kerlix as directed. Secured With: 64M Medipore H Soft Cloth Surgical T ape, 4 x 10 (in/yd) 1 x Per Day/30 Days Discharge Instructions: Secure with tape as directed. Secured With: tubigrip size E 1 x Per Day/30 Days Discharge Instructions: apply over the dressing to right leg- 1 layer. Electronic Signature(s) Signed: 07/04/2022 5:12:24 PM By: Worthy Keeler PA-C Signed: 07/04/2022 6:11:16 PM By: Deon Pilling RN, BSN Entered By: Deon Pilling on 07/04/2022 15:04:40 -------------------------------------------------------------------------------- Problem List Details Patient Name: Date of Service: Yesenia Kramer. 07/04/2022 2:15 PM Medical Record Number: [id number] Patient Account Number: [id number] Date of Birth/Sex: Treating RN: 01-13-1941 (81 y.o. [name] Primary Care Provider: Donald Prose Other Clinician: Referring Provider: Treating Provider/Extender: Barnie Alderman Weeks in  Treatment: 1 Active Problems ICD-10 Encounter Code Description Active Date MDM Diagnosis I89.0 Lymphedema, not elsewhere classified 06/27/2022 No Yes B35.4 Tinea corporis 06/27/2022 No Yes L97.812 Non-pressure chronic ulcer of other part of right lower leg with fat layer 06/27/2022 No Yes exposed Junction (primary) hypertension 06/27/2022 No Yes  D92.42 Personal history of transient ischemic attack (TIA), and cerebral infarction 06/27/2022 No Yes without residual deficits MIKEILA, BURGEN ([id number]) [id number] Page 4 of 6 Inactive Problems Resolved Problems Electronic Signature(s) Signed: 07/04/2022 2:44:59 PM By: Worthy Keeler PA-C Entered By: Worthy Keeler on 07/04/2022 14:44:58 -------------------------------------------------------------------------------- Progress Note Details Patient Name: Date of Service: TORII, ROYSE 07/04/2022 2:15 PM Medical Record Number: [id number] Patient Account Number: [id number] Date of Birth/Sex: Treating RN: July 25, 1941 (81 y.o. Helene Shoe, Tammi Klippel Primary Care Provider: Donald Prose Other Clinician: Referring Provider: Treating Provider/Extender: Barnie Alderman Weeks in Treatment: 1 Subjective Chief Complaint Information obtained from Patient Right LE Ulcer History of Present Illness (HPI) 06-27-2022 upon evaluation patient presents for initial evaluation here in the clinic concerning a wound over the anterior shin of the right lower extremity which occurred when she fell in the shower July 2023. She tells me that since that time she has been having a lot of issues here with getting this to heal. She tells me that initially she seemed to do decently well with regard to healing and so she ended up using a DuoDERM dressing that she purchased over-the-counter which subsequently led to increased pain as far as the wound itself was concerned. Following this she states that she developed a rash both around  the wound which was painful as well as a rash more recently on her torso and upper body as well. I am unsure if that upper body rash is related or separate completely from what is going on around her leg. The region around the leg appears to be potentially somewhat fungal. Patient was seen by her primary care provider on Monday of this week just 2 days ago and was given oral steroids as well as Keflex and seems to be doing somewhat better based on what she tells me today. This is actually really good news. I do not see any evidence of active worsening infection at this time. With that being said I do think there is a need for her to continue the Keflex as there is definitely some signs of potential cellulitis also believe there could be some fungal infection going on around the region of the wound in general. Patient does have a history of hypertension, TIAs, and lower extremity bilateral lymphedema. 07-04-2022 upon evaluation today patient appears to be doing well currently in regard to her wound. She is actually been tolerating the dressing changes without complication and the rash around the leg as well as the wound itself both appear to be doing significantly better which is great news. Fortunately I see no signs of infection locally or systemically at this time. Objective Constitutional Well-nourished and well-hydrated in no acute distress. Vitals Time Taken: 2:22 PM, Height: 65 in, Weight: 217 lbs, BMI: 36.1, Temperature: 98.4 F, Pulse: 83 bpm, Respiratory Rate: 18 breaths/min, Blood Pressure: 154/79 mmHg. Respiratory normal breathing without difficulty. Psychiatric this patient is able to make decisions and demonstrates good insight into disease process. Alert and Oriented x 3. pleasant and cooperative. General Notes: Upon inspection patient's wound bed showed evidence of good granulation and epithelization at this point. Fortunately I do not see any signs of active infection locally or  systemically which is great news and overall I am extremely pleased with where things stand. No fevers, chills, nausea, vomiting, or diarrhea. Integumentary (Hair, Skin) MARIESA, GRIEDER ([id number]) [id number] Page 5 of 6 Wound #1 status is Open. Original cause of wound was Trauma. The date acquired  was: 04/05/2022. The wound has been in treatment 1 weeks. The wound is located on the Right,Anterior Lower Leg. The wound measures 0.8cm length x 0.4cm width x 0.1cm depth; 0.251cm^2 area and 0.025cm^3 volume. There is Fat Layer (Subcutaneous Tissue) exposed. There is no tunneling or undermining noted. There is a medium amount of serosanguineous drainage noted. The wound margin is distinct with the outline attached to the wound base. There is medium (34-66%) red granulation within the wound bed. There is a medium (34-66%) amount of necrotic tissue within the wound bed including Adherent Slough. The periwound skin appearance exhibited: Rash, Dry/Scaly, Hemosiderin Staining, Erythema. The periwound skin appearance did not exhibit: Callus, Crepitus, Excoriation, Induration, Scarring, Maceration, Atrophie Blanche, Cyanosis, Ecchymosis, Mottled, Pallor, Rubor. The surrounding wound skin color is noted with erythema which is circumferential. Periwound temperature was noted as No Abnormality. The periwound has tenderness on palpation. Assessment Active Problems ICD-10 Lymphedema, not elsewhere classified Tinea corporis Non-pressure chronic ulcer of other part of right lower leg with fat layer exposed Essential (primary) hypertension Personal history of transient ischemic attack (TIA), and cerebral infarction without residual deficits Plan Follow-up Appointments: Return Appointment in 2 weeks. Jeri Cos, PA and [place], Room 8 Anesthetic: (In clinic) Topical Lidocaine 5% applied to wound bed Bathing/ Shower/ Hygiene: May shower and wash wound with soap and water. - with  dressing changes. Edema Control - Lymphedema / SCD / Other: Elevate legs to the level of the heart or above for 30 minutes daily and/or when sitting, a frequency of: - 3-4 times a day throughout the day. Avoid standing for long periods of time. Exercise regularly WOUND #1: - Lower Leg Wound Laterality: Right, Anterior Cleanser: Soap and Water 1 x Per Day/30 Days Discharge Instructions: May shower and wash wound with dial antibacterial soap and water prior to dressing change. Peri-Wound Care: Ketoconazole Cream 2% 1 x Per Day/30 Days Discharge Instructions: Apply Ketoconazole ****ONLY IN CLINIC.*** Peri-Wound Care: Nystatin ointment 1 x Per Day/30 Days Discharge Instructions: apply daily to right leg and wound bed. Prim Dressing: Hydrofera Blue Ready Foam, 4x5 in 1 x Per Day/30 Days ary Discharge Instructions: Apply to wound bed as instructed Secondary Dressing: ABD Pad, 8x10 1 x Per Day/30 Days Discharge Instructions: Apply over primary dressing as directed. Secured With: [employer], 4.5x3.1 (in/yd) 1 x Per Day/30 Days Discharge Instructions: Secure with Kerlix as directed. Secured With: 31M Medipore H Soft Cloth Surgical T ape, 4 x 10 (in/yd) 1 x Per Day/30 Days Discharge Instructions: Secure with tape as directed. Secured With: tubigrip size E 1 x Per Day/30 Days Discharge Instructions: apply over the dressing to right leg- 1 layer. 1. I am good recommend that we have the patient continue to monitor for any signs of infection and if anything changes she knows to let me know for now recommend continue with the nystatin cream followed by the [facility]. 2. Also to continue with the ABD pad and roll gauze to secure in place followed by the Tubigrip which I think is doing a great job. We will see patient back for reevaluation in 2 weeks here in the clinic. If anything worsens or changes patient will contact our office for additional recommendations. Electronic  Signature(s) Signed: 07/04/2022 3:07:18 PM By: Worthy Keeler PA-C Entered By: Worthy Keeler on 07/04/2022 15:07:17 -------------------------------------------------------------------------------- SuperBill Details Patient Name: Date of Service: Yesenia Kramer 07/04/2022 Medical Record Number: [id number] Patient Account Number: [id number] SHARISSA, BRIERLEY ([id number]) [id number] Page 6  of 6 Date of Birth/Sex: Treating RN: September 24, 1941 (81 y.o. Helene Shoe, Tammi Klippel Primary Care Provider: Donald Prose Other Clinician: Referring Provider: Treating Provider/Extender: Barnie Alderman Weeks in Treatment: 1 Diagnosis Coding ICD-10 Codes Code Description I89.0 Lymphedema, not elsewhere classified B35.4 Tinea corporis L97.812 Non-pressure chronic ulcer of other part of right lower leg with fat layer exposed I10 Essential (primary) hypertension Z86.73 Personal history of transient ischemic attack (TIA), and cerebral infarction without residual deficits Facility Procedures : CPT4 Code: [id number] Description: 99213 - WOUND CARE VISIT-LEV 3 EST PT Modifier: Quantity: 1 Physician Procedures : CPT4 Code Description Modifier [id number] - WC PHYS LEVEL 3 - EST PT ICD-10 Diagnosis Description I89.0 Lymphedema, not elsewhere classified B35.4 Tinea corporis L97.812 Non-pressure chronic ulcer of other part of right lower leg with fat layer  exposed I10 Essential (primary) hypertension Quantity: 1 Electronic Signature(s) Signed: 07/04/2022 3:07:44 PM By: Worthy Keeler PA-C Entered By: Worthy Keeler on 07/04/2022 15:07:43

## 2022-07-18 ENCOUNTER — Encounter (HOSPITAL_BASED_OUTPATIENT_CLINIC_OR_DEPARTMENT_OTHER): Payer: Medicare Other | Admitting: Physician Assistant

## 2022-07-18 DIAGNOSIS — I890 Lymphedema, not elsewhere classified: Secondary | ICD-10-CM | POA: Diagnosis not present

## 2022-07-18 DIAGNOSIS — I10 Essential (primary) hypertension: Secondary | ICD-10-CM | POA: Diagnosis not present

## 2022-07-18 DIAGNOSIS — L97812 Non-pressure chronic ulcer of other part of right lower leg with fat layer exposed: Secondary | ICD-10-CM | POA: Diagnosis not present

## 2022-07-18 DIAGNOSIS — Z8673 Personal history of transient ischemic attack (TIA), and cerebral infarction without residual deficits: Secondary | ICD-10-CM | POA: Diagnosis not present

## 2022-07-18 DIAGNOSIS — B354 Tinea corporis: Secondary | ICD-10-CM | POA: Diagnosis not present

## 2022-07-18 NOTE — Progress Notes (Addendum)
Yesenia, Kramer ([id number]) [id number] Page 1 of 5 Visit Report for 07/18/2022 Chief Complaint Document Details Patient Name: Date of Service: Yesenia Kramer, Yesenia Kramer 07/18/2022 1:30 PM Medical Record Number: [id number] Patient Account Number: [id number] Date of Birth/Sex: Treating RN: 1940-09-25 (81 y.o. F) Primary Care Provider: Donald Prose Other Clinician: Referring Provider: Treating Provider/Extender: Barnie Alderman Weeks in Treatment: 3 Information Obtained from: Patient Chief Complaint Right LE Ulcer Electronic Signature(s) Signed: 07/18/2022 1:53:32 PM By: Worthy Keeler PA-C Entered By: Worthy Keeler on 07/18/2022 13:53:31 -------------------------------------------------------------------------------- HPI Details Patient Name: Date of Service: Yesenia Kramer Yesenia Kramer 07/18/2022 1:30 PM Medical Record Number: [id number] Patient Account Number: [id number] Date of Birth/Sex: Treating RN: 03-Mar-1941 (81 y.o. F) Primary Care Provider: Donald Prose Other Clinician: Referring Provider: Treating Provider/Extender: Barnie Alderman Weeks in Treatment: 3 History of Present Illness HPI Description: 06-27-2022 upon evaluation patient presents for initial evaluation here in the clinic concerning a wound over the anterior shin of the right lower extremity which occurred when she fell in the shower July 2023. She tells me that since that time she has been having a lot of issues here with getting this to heal. She tells me that initially she seemed to do decently well with regard to healing and so she ended up using a DuoDERM dressing that she purchased over- the-counter which subsequently led to increased pain as far as the wound itself was concerned. Following this she states that she developed a rash both around the wound which was painful as well as a rash more recently on her torso and upper body as well. I am unsure if that upper body rash is  related or separate completely from what is going on around her leg. The region around the leg appears to be potentially somewhat fungal. Patient was seen by her primary care provider on Monday of this week just 2 days ago and was given oral steroids as well as Keflex and seems to be doing somewhat better based on what she tells me today. This is actually really good news. I do not see any evidence of active worsening infection at this time. With that being said I do think there is a need for her to continue the Keflex as there is definitely some signs of potential cellulitis also believe there could be some fungal infection going on around the region of the wound in general. Patient does have a history of hypertension, TIAs, and lower extremity bilateral lymphedema. 07-04-2022 upon evaluation today patient appears to be doing well currently in regard to her wound. She is actually been tolerating the dressing changes without complication and the rash around the leg as well as the wound itself both appear to be doing significantly better which is great news. Fortunately I see no signs of infection locally or systemically at this time. 07-18-2022 upon evaluation today patient appears to be doing well currently in regard to her wound. She has been tolerating the dressing changes without complication. Fortunately there does not appear to be any evidence of active infection at this time which is great news. No fevers, chills, nausea, vomiting, or diarrhea. In fact I actually believe she is completely healed. Electronic Signature(s) Signed: 07/18/2022 2:03:48 PM By: Worthy Keeler PA-C Entered By: Worthy Keeler on 07/18/2022 14:03:48 Yesenia Kramer ([id number]) [id number] Page 2 of 5 -------------------------------------------------------------------------------- Physical Exam Details Patient Name: Date of Service: Yesenia, Kramer. 07/18/2022 1:30 PM Medical Record  Number:  [id number] Patient Account Number: [id number] Date of Birth/Sex: Treating RN: April 03, 1941 (81 y.o. F) Primary Care Provider: Donald Prose Other Clinician: Referring Provider: Treating Provider/Extender: Barnie Alderman Weeks in Treatment: 3 Constitutional Well-nourished and well-hydrated in no acute distress. Respiratory normal breathing without difficulty. Psychiatric this patient is able to make decisions and demonstrates good insight into disease process. Alert and Oriented x 3. pleasant and cooperative. Notes Patient's wound is completely healed and seems to be doing quite well. I am extremely pleased in that regard. I do not see any signs of infection currently. Electronic Signature(s) Signed: 07/18/2022 2:06:01 PM By: Worthy Keeler PA-C Entered By: Worthy Keeler on 07/18/2022 14:06:01 -------------------------------------------------------------------------------- Physician Orders Details Patient Name: Date of Service: Yesenia Kramer, Yesenia Kramer 07/18/2022 1:30 PM Medical Record Number: [id number] Patient Account Number: [id number] Date of Birth/Sex: Treating RN: 10/17/1940 (81 y.o. Yesenia Kramer Primary Care Provider: Donald Prose Other Clinician: Referring Provider: Treating Provider/Extender: Barnie Alderman Weeks in Treatment: 3 Verbal / Phone Orders: No Diagnosis Coding ICD-10 Coding Code Description I89.0 Lymphedema, not elsewhere classified B35.4 Tinea corporis L97.812 Non-pressure chronic ulcer of other part of right lower leg with fat layer exposed I10 Essential (primary) hypertension Z86.73 Personal history of transient ischemic attack (TIA), and cerebral infarction without residual deficits Discharge From [facility] Services Discharge from [place] - Call if you need any future wound care needs. Purchase and Apply Aand D ointment to right leg daily for 1-2 months. No need to use the steroid cream. Edema Control - Lymphedema / SCD /  Other Elevate legs to the level of the heart or above for 30 minutes daily and/or when sitting, a frequency of: - 3-4 times a day throughout the day. Avoid standing for long periods of time. Exercise regularly Moisturize legs daily. Compression stocking or Garment 20-30 mm/Hg pressure to: - apply compression stocking in the morning and remove at night. Will apply tubigrip for compression today. Electronic Signature(s) Signed: 07/18/2022 6:42:42 PM By: Curtis Sites, Dalbert Garnet ([id number]) [id number] Page 3 of 5 Signed: 07/18/2022 6:57:47 PM By: Deon Pilling RN, BSN Entered By: Deon Pilling on 07/18/2022 13:59:03 -------------------------------------------------------------------------------- Problem List Details Patient Name: Date of Service: Yesenia Kramer Yesenia L. 07/18/2022 1:30 PM Medical Record Number: [id number] Patient Account Number: [id number] Date of Birth/Sex: Treating RN: 01-31-1941 (81 y.o. F) Primary Care Provider: Donald Prose Other Clinician: Referring Provider: Treating Provider/Extender: Barnie Alderman Weeks in Treatment: 3 Active Problems ICD-10 Encounter Code Description Active Date MDM Diagnosis I89.0 Lymphedema, not elsewhere classified 06/27/2022 No Yes B35.4 Tinea corporis 06/27/2022 No Yes L97.812 Non-pressure chronic ulcer of other part of right lower leg with fat layer 06/27/2022 No Yes exposed [place] (primary) hypertension 06/27/2022 No Yes Z86.73 Personal history of transient ischemic attack (TIA), and cerebral infarction 06/27/2022 No Yes without residual deficits Inactive Problems Resolved Problems Electronic Signature(s) Signed: 07/18/2022 1:53:25 PM By: Worthy Keeler PA-C Entered By: Worthy Keeler on 07/18/2022 13:53:24 -------------------------------------------------------------------------------- Progress Note Details Patient Name: Date of Service: Yesenia Kramer Yesenia L. 07/18/2022 1:30  PM Medical Record Number: [id number] Patient Account Number: [id number] Date of Birth/Sex: Treating RN: [birth year] (81 y.o. F) Primary Care Provider: Donald Prose Other Clinician: Referring Provider: Treating Provider/Extender: Whitman Hero, Gari Crown Weeks in Treatment: [address] [place], [place] ([id number]) [id number] Page 4 of 5 Chief Complaint Information obtained from Patient Right LE Ulcer History of Present Illness (HPI) 06-27-2022 upon evaluation patient presents for  initial evaluation here in the clinic concerning a wound over the anterior shin of the right lower extremity which occurred when she fell in the shower July 2023. She tells me that since that time she has been having a lot of issues here with getting this to heal. She tells me that initially she seemed to do decently well with regard to healing and so she ended up using a DuoDERM dressing that she purchased over-the-counter which subsequently led to increased pain as far as the wound itself was concerned. Following this she states that she developed a rash both around the wound which was painful as well as a rash more recently on her torso and upper body as well. I am unsure if that upper body rash is related or separate completely from what is going on around her leg. The region around the leg appears to be potentially somewhat fungal. Patient was seen by her primary care provider on Monday of this week just 2 days ago and was given oral steroids as well as Keflex and seems to be doing somewhat better based on what she tells me today. This is actually really good news. I do not see any evidence of active worsening infection at this time. With that being said I do think there is a need for her to continue the Keflex as there is definitely some signs of potential cellulitis also believe there could be some fungal infection going on around the region of the wound in general. Patient does have  a history of hypertension, TIAs, and lower extremity bilateral lymphedema. 07-04-2022 upon evaluation today patient appears to be doing well currently in regard to her wound. She is actually been tolerating the dressing changes without complication and the rash around the leg as well as the wound itself both appear to be doing significantly better which is great news. Fortunately I see no signs of infection locally or systemically at this time. 07-18-2022 upon evaluation today patient appears to be doing well currently in regard to her wound. She has been tolerating the dressing changes without complication. Fortunately there does not appear to be any evidence of active infection at this time which is great news. No fevers, chills, nausea, vomiting, or diarrhea. In fact I actually believe she is completely healed. Objective Constitutional Well-nourished and well-hydrated in no acute distress. Vitals Time Taken: 1:36 PM, Height: 65 in, Weight: 217 lbs, BMI: 36.1, Temperature: 97.6 F, Pulse: 59 bpm, Respiratory Rate: 18 breaths/min, Blood Pressure: 145/84 mmHg. Respiratory normal breathing without difficulty. Psychiatric this patient is able to make decisions and demonstrates good insight into disease process. Alert and Oriented x 3. pleasant and cooperative. General Notes: Patient's wound is completely healed and seems to be doing quite well. I am extremely pleased in that regard. I do not see any signs of infection currently. Integumentary (Hair, Skin) Wound #1 status is Healed - Epithelialized. Original cause of wound was Trauma. The date acquired was: 04/05/2022. The wound has been in treatment 3 weeks. The wound is located on the Right,Anterior Lower Leg. The wound measures 0cm length x 0cm width x 0cm depth; 0cm^2 area and 0cm^3 volume. There is Fat Layer (Subcutaneous Tissue) exposed. There is no tunneling or undermining noted. There is a medium amount of serosanguineous drainage noted.  The wound margin is distinct with the outline attached to the wound base. There is large (67-100%) red granulation within the wound bed. There is no necrotic tissue within the wound bed. The periwound skin  appearance exhibited: Rash, Dry/Scaly, Hemosiderin Staining, Erythema. The periwound skin appearance did not exhibit: Callus, Crepitus, Excoriation, Induration, Scarring, Maceration, Atrophie Blanche, Cyanosis, Ecchymosis, Mottled, Pallor, Rubor. The surrounding wound skin color is noted with erythema which is circumferential. Periwound temperature was noted as No Abnormality. The periwound has tenderness on palpation. Assessment Active Problems ICD-10 Lymphedema, not elsewhere classified Tinea corporis Non-pressure chronic ulcer of other part of right lower leg with fat layer exposed Essential (primary) hypertension Personal history of transient ischemic attack (TIA), and cerebral infarction without residual deficits Plan Discharge From [facility] Services: Discharge from [place] - Call if you need any future wound care needs. Purchase and Apply Aand D ointment to right leg daily for 1-2 months. No Yesenia Kramer, Yesenia Kramer ([id number]) [id number] Page 5 of 5 need to use the steroid cream. Edema Control - Lymphedema / SCD / Other: Elevate legs to the level of the heart or above for 30 minutes daily and/or when sitting, a frequency of: - 3-4 times a day throughout the day. Avoid standing for long periods of time. Exercise regularly Moisturize legs daily. Compression stocking or Garment 20-30 mm/Hg pressure to: - apply compression stocking in the morning and remove at night. Will apply tubigrip for compression today. 1. I am going to recommend that we have the patient continue to monitor for any signs of worsening or infection. Obviously based on what I am seeing right now I do believe that we are showing signs of good improvement and in fact she does appear to be  completely healed. 2. I am also can suggest that she use some AandD ointment as well as an ABD pad and Tubigrip to help protect the area as far as her leg is concerned. Obviously we want to make sure that she does not have this reopened in the interim between now and when this has a chance to really toughen. She is in agreement with that plan. We will see her back for follow-up visit as needed. Electronic Signature(s) Signed: 07/18/2022 2:06:54 PM By: Worthy Keeler PA-C Entered By: Worthy Keeler on 07/18/2022 14:06:54 -------------------------------------------------------------------------------- SuperBill Details Patient Name: Date of Service: Yesenia Kramer, Yesenia Kramer 07/18/2022 Medical Record Number: [id number] Patient Account Number: [id number] Date of Birth/Sex: Treating RN: 02/26/1941 (81 y.o. F) Primary Care Provider: Donald Prose Other Clinician: Referring Provider: Treating Provider/Extender: Barnie Alderman Weeks in Treatment: 3 Diagnosis Coding ICD-10 Codes Code Description I89.0 Lymphedema, not elsewhere classified B35.4 Tinea corporis L97.812 Non-pressure chronic ulcer of other part of right lower leg with fat layer exposed I10 Essential (primary) hypertension Z86.73 Personal history of transient ischemic attack (TIA), and cerebral infarction without residual deficits Facility Procedures : CPT4 Code: [id number] Description: 99213 - WOUND CARE VISIT-LEV 3 EST PT Modifier: Quantity: 1 Physician Procedures : CPT4 Code Description Modifier [id number] - WC PHYS LEVEL 3 - EST PT ICD-10 Diagnosis Description I89.0 Lymphedema, not elsewhere classified B35.4 Tinea corporis L97.812 Non-pressure chronic ulcer of other part of right lower leg with fat layer  exposed I10 Essential (primary) hypertension Quantity: 1 Electronic Signature(s) Signed: 07/18/2022 6:57:47 PM By: Deon Pilling RN, BSN Signed: 07/24/2022 12:44:21 PM By: Worthy Keeler PA-C Previous Signature:  07/18/2022 2:07:21 PM Version By: Worthy Keeler PA-C Entered By: Deon Pilling on 07/18/2022 18:53:00

## 2022-07-19 NOTE — Progress Notes (Signed)
SUMNER, BOESCH ([id number]) [id number] Page 1 of 7 Visit Report for 07/18/2022 Arrival Information Details Patient Name: Date of Service: Yesenia Kramer, Yesenia Kramer 07/18/2022 1:30 PM Medical Record Number: [id number] Patient Account Number: [id number] Date of Birth/Sex: Treating RN: 03-Sep-1941 (81 y.o. Donalda Ewings Primary Care Roshan Roback: Donald Prose Other Clinician: Referring Avin Upperman: Treating Madysin Crisp/Extender: Barnie Alderman Weeks in Treatment: 3 Visit Information History Since Last Visit Added or deleted any medications: No Patient Arrived: Ambulatory Any new allergies or adverse reactions: No Arrival Time: 13:34 Had a fall or experienced change in No Accompanied By: self activities of daily living that may affect Transfer Assistance: None risk of falls: Patient Identification Verified: Yes Signs or symptoms of abuse/neglect since last visito No Secondary Verification Process Completed: Yes Hospitalized since last visit: No Patient Requires Transmission-Based No Implantable device outside of the clinic excluding No Precautions: cellular tissue based products placed in the center Patient Has Alerts: Yes since last visit: Patient Alerts: DO NOT USE LEFT ARM Has Dressing in Place as Prescribed: Yes Has Compression in Place as Prescribed: Yes Pain Present Now: No Electronic Signature(s) Signed: 07/18/2022 5:59:56 PM By: Sharyn Creamer RN, BSN Entered By: Sharyn Creamer on 07/18/2022 13:36:33 -------------------------------------------------------------------------------- Clinic Level of Care Assessment Details Patient Name: Date of Service: Yesenia Kramer, Yesenia Kramer 07/18/2022 1:30 PM Medical Record Number: [id number] Patient Account Number: [id number] Date of Birth/Sex: Treating RN: Jun 13, 1941 (81 y.o. [name] Primary Care Linette Gunderson: Donald Prose Other Clinician: Referring Raelin Pixler: Treating Grainne Knights/Extender: Barnie Alderman Weeks in Treatment: 3 Clinic Level of Care Assessment Items TOOL 4 Quantity Score X- 1 0 Use when only an EandM is performed on FOLLOW-UP visit ASSESSMENTS - Nursing Assessment / Reassessment X- 1 10 Reassessment of Co-morbidities (includes updates in patient status) X- 1 5 Reassessment of Adherence to Treatment Plan ASSESSMENTS - Wound and Skin A ssessment / Reassessment X - Simple Wound Assessment / Reassessment - one wound 1 5 '[]'$  - 0 Complex Wound Assessment / Reassessment - multiple wounds X- 1 10 Dermatologic / Skin Assessment (not related to wound area) ASSESSMENTS - Focused Assessment X- 1 5 Circumferential Edema Measurements - multi extremities '[]'$  - 0 Nutritional Assessment / Counseling / Intervention TYRELL, BRERETON ([id number]) [id number] Page 2 of 7 '[]'$  - 0 Lower Extremity Assessment (monofilament, tuning fork, pulses) '[]'$  - 0 Peripheral Arterial Disease Assessment (using hand held doppler) ASSESSMENTS - Ostomy and/or Continence Assessment and Care '[]'$  - 0 Incontinence Assessment and Management '[]'$  - 0 Ostomy Care Assessment and Management (repouching, etc.) PROCESS - Coordination of Care X - Simple Patient / Family Education for ongoing care 1 15 '[]'$  - 0 Complex (extensive) Patient / Family Education for ongoing care X- 1 10 Staff obtains Programmer, systems, Records, T Results / Process Orders est '[]'$  - 0 Staff telephones HHA, Nursing Homes / Clarify orders / etc '[]'$  - 0 Routine Transfer to another Facility (non-emergent condition) '[]'$  - 0 Routine Hospital Admission (non-emergent condition) '[]'$  - 0 New Admissions / Biomedical engineer / Ordering NPWT Apligraf, etc. , '[]'$  - 0 Emergency Hospital Admission (emergent condition) X- 1 10 Simple Discharge Coordination '[]'$  - 0 Complex (extensive) Discharge Coordination PROCESS - Special Needs '[]'$  - 0 Pediatric / Minor Patient Management '[]'$  - 0 Isolation Patient Management '[]'$  - 0 Hearing /  Language / Visual special needs '[]'$  - 0 Assessment of Community assistance (transportation, D/C planning, etc.) '[]'$  - 0 Additional assistance / Altered mentation '[]'$  - 0 Support Surface(s)  Assessment (bed, cushion, seat, etc.) INTERVENTIONS - Wound Cleansing / Measurement X - Simple Wound Cleansing - one wound 1 5 '[]'$  - 0 Complex Wound Cleansing - multiple wounds X- 1 5 Wound Imaging (photographs - any number of wounds) '[]'$  - 0 Wound Tracing (instead of photographs) X- 1 5 Simple Wound Measurement - one wound '[]'$  - 0 Complex Wound Measurement - multiple wounds INTERVENTIONS - Wound Dressings X - Small Wound Dressing one or multiple wounds 1 10 '[]'$  - 0 Medium Wound Dressing one or multiple wounds '[]'$  - 0 Large Wound Dressing one or multiple wounds '[]'$  - 0 Application of Medications - topical '[]'$  - 0 Application of Medications - injection INTERVENTIONS - Miscellaneous '[]'$  - 0 External ear exam '[]'$  - 0 Specimen Collection (cultures, biopsies, blood, body fluids, etc.) '[]'$  - 0 Specimen(s) / Culture(s) sent or taken to Lab for analysis '[]'$  - 0 Patient Transfer (multiple staff / Civil Service fast streamer / Similar devices) '[]'$  - 0 Simple Staple / Suture removal (25 or less) '[]'$  - 0 Complex Staple / Suture removal (26 or more) '[]'$  - 0 Hypo / Hyperglycemic Management (close monitor of Blood Glucose) Yesenia Kramer, Yesenia Kramer ([id number]) [id number] Page 3 of 7 '[]'$  - 0 Ankle / Brachial Index (ABI) - do not check if billed separately X- 1 5 Vital Signs Has the patient been seen at the hospital within the last three years: Yes Total Score: 100 Level Of Care: New/Established - Level 3 Electronic Signature(s) Signed: 07/18/2022 6:57:47 PM By: Deon Pilling RN, BSN Entered By: Deon Pilling on 07/18/2022 13:57:39 -------------------------------------------------------------------------------- Encounter Discharge Information Details Patient Name: Date of Service: Yesenia Sabal NIE L. 07/18/2022  1:30 PM Medical Record Number: [id number] Patient Account Number: [id number] Date of Birth/Sex: Treating RN: 1940/11/26 (81 y.o. [name] Primary Care Dorette Hartel: Donald Prose Other Clinician: Referring Samarion Ehle: Treating Teshawn Moan/Extender: Barnie Alderman Weeks in Treatment: 3 Encounter Discharge Information Items Discharge Condition: Stable Ambulatory Status: Ambulatory Discharge Destination: Home Transportation: Private Auto Accompanied By: self Schedule Follow-up Appointment: No Clinical Summary of Care: Notes tubgrip size E applied Sheva Mcdougle. Electronic Signature(s) Signed: 07/18/2022 6:57:47 PM By: Deon Pilling RN, BSN Entered By: Deon Pilling on 07/18/2022 13:59:52 -------------------------------------------------------------------------------- Lower Extremity Assessment Details Patient Name: Date of Service: Yesenia Kramer, Yesenia Kramer 07/18/2022 1:30 PM Medical Record Number: [id number] Patient Account Number: [id number] Date of Birth/Sex: Treating RN: 15-Jun-1941 (81 y.o. Donalda Ewings Primary Care Elli Groesbeck: Donald Prose Other Clinician: Referring Klare Criss: Treating Sheva Mcdougle/Extender: Barnie Alderman Weeks in Treatment: 3 Edema Assessment Assessed: [Left: No] [Right: No] Edema: [Left: Ye] [Right: s] Calf Left: Right: Point of Measurement: 42 cm From Medial Instep 40.5 cm Ankle Left: Right: Point of Measurement: 10 cm From Medial Instep 24 cm Yesenia Kramer, Yesenia Kramer ([id number]) [id number] Page 4 of 7 Vascular Assessment Pulses: Dorsalis Pedis Palpable: [Right:Yes] Electronic Signature(s) Signed: 07/18/2022 5:59:56 PM By: Sharyn Creamer RN, BSN Entered By: Sharyn Creamer on 07/18/2022 13:45:29 -------------------------------------------------------------------------------- [place] Details Patient Name: Date of Service: Yesenia Sabal NIE L. 07/18/2022 1:30 PM Medical Record Number: [id number] Patient  Account Number: [id number] Date of Birth/Sex: Treating RN: August 14, 1941 (81 y.o. [name] Primary Care Dorrene Bently: Donald Prose Other Clinician: Referring Jermani Eberlein: Treating Julio Storr/Extender: Barnie Alderman Weeks in Treatment: 3 Active Inactive Electronic Signature(s) Signed: 07/18/2022 6:57:47 PM By: Deon Pilling RN, BSN Entered By: Deon Pilling on 07/18/2022 13:57:07 -------------------------------------------------------------------------------- Pain Assessment Details Patient Name: Date of Service: Yesenia Sabal NIE L. 07/18/2022 1:30 PM Medical Record  Number: [id number] Patient Account Number: [id number] Date of Birth/Sex: Treating RN: 1940-11-16 (81 y.o. Donalda Ewings Primary Care Keyatta Tolles: Donald Prose Other Clinician: Referring Jonah Nestle: Treating Breea Loncar/Extender: Barnie Alderman Weeks in Treatment: 3 Active Problems Location of Pain Severity and Description of Pain Patient Has Paino No Site Locations Yesenia Kramer, Yesenia Kramer ([id number]) [id number] Page 5 of 7 Pain Management and Medication Current Pain Management: Electronic Signature(s) Signed: 07/18/2022 5:59:56 PM By: Sharyn Creamer RN, BSN Entered By: Sharyn Creamer on 07/18/2022 13:38:04 -------------------------------------------------------------------------------- Patient/Caregiver Education Details Patient Name: Date of Service: Yesenia Kramer 10/25/2023andnbsp1:30 PM Medical Record Number: [id number] Patient Account Number: [id number] Date of Birth/Gender: Treating RN: 10-06-1940 (81 y.o. [name] Primary Care Physician: Donald Prose Other Clinician: Referring Physician: Treating Physician/Extender: Barnie Alderman Weeks in Treatment: 3 Education Assessment Education Provided To: Patient Education Topics Provided Wound/Skin Impairment: Handouts: Skin Care Do's and Dont's Methods: Explain/Verbal Responses: Reinforcements  needed Electronic Signature(s) Signed: 07/18/2022 6:57:47 PM By: Deon Pilling RN, BSN Entered By: Deon Pilling on 07/18/2022 13:57:17 -------------------------------------------------------------------------------- Wound Assessment Details Patient Name: Date of Service: Yesenia Sabal NIE L. 07/18/2022 1:30 PM Medical Record Number: [id number] Patient Account Number: [id number] Date of Birth/Sex: Treating RN: 04-Nov-1940 (81 y.o. [name] Primary Care Gregroy Dombkowski: Donald Prose Other Clinician: ADONNA, Yesenia Kramer ([id number]) [id number] Page 6 of 7 Referring Zeppelin Beckstrand: Treating Finnegan Gatta/Extender: Barnie Alderman Weeks in Treatment: 3 Wound Status Wound Number: 1 Primary Etiology: Lymphedema Wound Location: Right, Anterior Lower Leg Secondary Etiology: Abrasion Wounding Event: Trauma Wound Status: Healed - Epithelialized Date Acquired: 04/05/2022 Comorbid History: Cataracts, Anemia, Hypertension Weeks Of Treatment: 3 Clustered Wound: No Photos Wound Measurements Length: (cm) Width: (cm) Depth: (cm) Area: (cm) Volume: (cm) 0 % Reduction in Area: 100% 0 % Reduction in Volume: 100% 0 Epithelialization: Large (67-100%) 0 Tunneling: No 0 Undermining: No Wound Description Classification: Full Thickness Without Exposed Suppo Wound Margin: Distinct, outline attached Exudate Amount: Medium Exudate Type: Serosanguineous Exudate Color: red, brown rt Structures Foul Odor After Cleansing: No Slough/Fibrino Yes Wound Bed Granulation Amount: Large (67-100%) Exposed Structure Granulation Quality: Red Fascia Exposed: No Necrotic Amount: None Present (0%) Fat Layer (Subcutaneous Tissue) Exposed: Yes Tendon Exposed: No Muscle Exposed: No Joint Exposed: No Bone Exposed: No Periwound Skin Texture Texture Color No Abnormalities Noted: No No Abnormalities Noted: No Callus: No Atrophie Blanche: No Crepitus: No Cyanosis: No Excoriation:  No Ecchymosis: No Induration: No Erythema: Yes Rash: Yes Erythema Location: Circumferential Scarring: No Erythema Change: Decreased Hemosiderin Staining: Yes Moisture Mottled: No No Abnormalities Noted: No Pallor: No Dry / Scaly: Yes Rubor: No Maceration: No Temperature / Pain Temperature: No Abnormality Tenderness on Palpation: Yes Electronic Signature(s) Signed: 07/18/2022 6:57:47 PM By: Deon Pilling RN, BSN Entered By: Deon Pilling on 07/18/2022 13:55:59 Yesenia Kramer ([id number]) [id number] Page 7 of 7 -------------------------------------------------------------------------------- Vitals Details Patient Name: Date of Service: Yesenia Kramer, Yesenia Kramer 07/18/2022 1:30 PM Medical Record Number: [id number] Patient Account Number: [id number] Date of Birth/Sex: Treating RN: 17-Aug-1941 (81 y.o. Donalda Ewings Primary Care Cherokee Clowers: Donald Prose Other Clinician: Referring Kenlie Seki: Treating Marijane Trower/Extender: Barnie Alderman Weeks in Treatment: 3 Vital Signs Time Taken: 13:36 Temperature (F): 97.6 Height (in): 65 Pulse (bpm): 59 Weight (lbs): 217 Respiratory Rate (breaths/min): 18 Body Mass Index (BMI): 36.1 Blood Pressure (mmHg): 145/84 Reference Range: 80 - 120 mg / dl Electronic Signature(s) Signed: 07/18/2022 5:59:56 PM By: Sharyn Creamer RN, BSN Entered By: Sharyn Creamer on 07/18/2022 13:37:57

## 2022-08-07 DIAGNOSIS — R21 Rash and other nonspecific skin eruption: Secondary | ICD-10-CM | POA: Diagnosis not present

## 2022-08-07 DIAGNOSIS — L818 Other specified disorders of pigmentation: Secondary | ICD-10-CM | POA: Diagnosis not present

## 2022-08-07 DIAGNOSIS — L2389 Allergic contact dermatitis due to other agents: Secondary | ICD-10-CM | POA: Diagnosis not present

## 2022-10-03 DIAGNOSIS — L821 Other seborrheic keratosis: Secondary | ICD-10-CM | POA: Diagnosis not present

## 2022-10-03 DIAGNOSIS — L2389 Allergic contact dermatitis due to other agents: Secondary | ICD-10-CM | POA: Diagnosis not present

## 2022-10-12 DIAGNOSIS — Z1231 Encounter for screening mammogram for malignant neoplasm of breast: Secondary | ICD-10-CM | POA: Diagnosis not present

## 2022-10-15 DIAGNOSIS — C50412 Malignant neoplasm of upper-outer quadrant of left female breast: Secondary | ICD-10-CM | POA: Diagnosis not present

## 2022-10-15 DIAGNOSIS — Z170 Estrogen receptor positive status [ER+]: Secondary | ICD-10-CM | POA: Diagnosis not present

## 2022-10-17 DIAGNOSIS — C50812 Malignant neoplasm of overlapping sites of left female breast: Secondary | ICD-10-CM | POA: Diagnosis not present

## 2022-10-17 DIAGNOSIS — Z79811 Long term (current) use of aromatase inhibitors: Secondary | ICD-10-CM | POA: Diagnosis not present

## 2022-10-17 DIAGNOSIS — D72819 Decreased white blood cell count, unspecified: Secondary | ICD-10-CM | POA: Diagnosis not present

## 2022-10-17 DIAGNOSIS — D649 Anemia, unspecified: Secondary | ICD-10-CM | POA: Diagnosis not present

## 2022-10-17 DIAGNOSIS — Z9012 Acquired absence of left breast and nipple: Secondary | ICD-10-CM | POA: Diagnosis not present

## 2022-10-17 DIAGNOSIS — C50412 Malignant neoplasm of upper-outer quadrant of left female breast: Secondary | ICD-10-CM | POA: Diagnosis not present

## 2022-10-17 DIAGNOSIS — Z170 Estrogen receptor positive status [ER+]: Secondary | ICD-10-CM | POA: Diagnosis not present

## 2022-11-13 DIAGNOSIS — E1169 Type 2 diabetes mellitus with other specified complication: Secondary | ICD-10-CM | POA: Diagnosis not present

## 2022-11-13 DIAGNOSIS — I10 Essential (primary) hypertension: Secondary | ICD-10-CM | POA: Diagnosis not present

## 2022-11-13 DIAGNOSIS — E785 Hyperlipidemia, unspecified: Secondary | ICD-10-CM | POA: Diagnosis not present

## 2022-11-19 DIAGNOSIS — I872 Venous insufficiency (chronic) (peripheral): Secondary | ICD-10-CM | POA: Diagnosis not present

## 2022-11-19 DIAGNOSIS — D234 Other benign neoplasm of skin of scalp and neck: Secondary | ICD-10-CM | POA: Diagnosis not present

## 2022-11-19 DIAGNOSIS — D2339 Other benign neoplasm of skin of other parts of face: Secondary | ICD-10-CM | POA: Diagnosis not present

## 2022-11-26 DIAGNOSIS — I872 Venous insufficiency (chronic) (peripheral): Secondary | ICD-10-CM | POA: Diagnosis not present

## 2022-12-17 DIAGNOSIS — E785 Hyperlipidemia, unspecified: Secondary | ICD-10-CM | POA: Diagnosis not present

## 2022-12-17 DIAGNOSIS — E1169 Type 2 diabetes mellitus with other specified complication: Secondary | ICD-10-CM | POA: Diagnosis not present

## 2022-12-17 DIAGNOSIS — I69319 Unspecified symptoms and signs involving cognitive functions following cerebral infarction: Secondary | ICD-10-CM | POA: Diagnosis not present

## 2022-12-17 DIAGNOSIS — C50919 Malignant neoplasm of unspecified site of unspecified female breast: Secondary | ICD-10-CM | POA: Diagnosis not present

## 2022-12-17 DIAGNOSIS — Z0000 Encounter for general adult medical examination without abnormal findings: Secondary | ICD-10-CM | POA: Diagnosis not present

## 2022-12-17 DIAGNOSIS — Z23 Encounter for immunization: Secondary | ICD-10-CM | POA: Diagnosis not present

## 2022-12-17 DIAGNOSIS — I10 Essential (primary) hypertension: Secondary | ICD-10-CM | POA: Diagnosis not present

## 2022-12-17 DIAGNOSIS — Z1331 Encounter for screening for depression: Secondary | ICD-10-CM | POA: Diagnosis not present

## 2023-01-09 DIAGNOSIS — E2839 Other primary ovarian failure: Secondary | ICD-10-CM | POA: Diagnosis not present

## 2023-01-09 DIAGNOSIS — Z780 Asymptomatic menopausal state: Secondary | ICD-10-CM | POA: Diagnosis not present

## 2023-01-14 DIAGNOSIS — L298 Other pruritus: Secondary | ICD-10-CM | POA: Diagnosis not present

## 2023-01-14 DIAGNOSIS — L821 Other seborrheic keratosis: Secondary | ICD-10-CM | POA: Diagnosis not present

## 2023-01-14 DIAGNOSIS — I872 Venous insufficiency (chronic) (peripheral): Secondary | ICD-10-CM | POA: Diagnosis not present

## 2023-03-22 DIAGNOSIS — Z23 Encounter for immunization: Secondary | ICD-10-CM | POA: Diagnosis not present

## 2023-04-26 DIAGNOSIS — E1159 Type 2 diabetes mellitus with other circulatory complications: Secondary | ICD-10-CM | POA: Diagnosis not present

## 2023-06-12 DIAGNOSIS — C50412 Malignant neoplasm of upper-outer quadrant of left female breast: Secondary | ICD-10-CM | POA: Diagnosis not present

## 2023-06-12 DIAGNOSIS — Z170 Estrogen receptor positive status [ER+]: Secondary | ICD-10-CM | POA: Diagnosis not present

## 2023-10-15 DIAGNOSIS — Z1231 Encounter for screening mammogram for malignant neoplasm of breast: Secondary | ICD-10-CM | POA: Diagnosis not present

## 2023-10-15 DIAGNOSIS — Z170 Estrogen receptor positive status [ER+]: Secondary | ICD-10-CM | POA: Diagnosis not present

## 2023-10-15 DIAGNOSIS — C50412 Malignant neoplasm of upper-outer quadrant of left female breast: Secondary | ICD-10-CM | POA: Diagnosis not present

## 2023-11-27 DIAGNOSIS — Z170 Estrogen receptor positive status [ER+]: Secondary | ICD-10-CM | POA: Diagnosis not present

## 2023-11-27 DIAGNOSIS — C50412 Malignant neoplasm of upper-outer quadrant of left female breast: Secondary | ICD-10-CM | POA: Diagnosis not present

## 2023-12-10 DIAGNOSIS — Z170 Estrogen receptor positive status [ER+]: Secondary | ICD-10-CM | POA: Diagnosis not present

## 2023-12-10 DIAGNOSIS — Z79811 Long term (current) use of aromatase inhibitors: Secondary | ICD-10-CM | POA: Diagnosis not present

## 2023-12-10 DIAGNOSIS — C50412 Malignant neoplasm of upper-outer quadrant of left female breast: Secondary | ICD-10-CM | POA: Diagnosis not present

## 2024-01-06 DIAGNOSIS — M5430 Sciatica, unspecified side: Secondary | ICD-10-CM | POA: Diagnosis not present

## 2024-01-06 DIAGNOSIS — Z853 Personal history of malignant neoplasm of breast: Secondary | ICD-10-CM | POA: Diagnosis not present

## 2024-01-06 DIAGNOSIS — Z0000 Encounter for general adult medical examination without abnormal findings: Secondary | ICD-10-CM | POA: Diagnosis not present

## 2024-01-06 DIAGNOSIS — E785 Hyperlipidemia, unspecified: Secondary | ICD-10-CM | POA: Diagnosis not present

## 2024-01-06 DIAGNOSIS — I69319 Unspecified symptoms and signs involving cognitive functions following cerebral infarction: Secondary | ICD-10-CM | POA: Diagnosis not present

## 2024-01-06 DIAGNOSIS — I10 Essential (primary) hypertension: Secondary | ICD-10-CM | POA: Diagnosis not present

## 2024-01-06 DIAGNOSIS — Z1331 Encounter for screening for depression: Secondary | ICD-10-CM | POA: Diagnosis not present

## 2024-01-06 DIAGNOSIS — E1169 Type 2 diabetes mellitus with other specified complication: Secondary | ICD-10-CM | POA: Diagnosis not present

## 2024-01-14 DIAGNOSIS — I872 Venous insufficiency (chronic) (peripheral): Secondary | ICD-10-CM | POA: Diagnosis not present

## 2024-01-14 DIAGNOSIS — L669 Cicatricial alopecia, unspecified: Secondary | ICD-10-CM | POA: Diagnosis not present

## 2024-01-14 DIAGNOSIS — L930 Discoid lupus erythematosus: Secondary | ICD-10-CM | POA: Diagnosis not present

## 2024-01-14 DIAGNOSIS — R21 Rash and other nonspecific skin eruption: Secondary | ICD-10-CM | POA: Diagnosis not present

## 2024-01-22 DIAGNOSIS — E785 Hyperlipidemia, unspecified: Secondary | ICD-10-CM | POA: Diagnosis not present

## 2024-01-22 DIAGNOSIS — C50919 Malignant neoplasm of unspecified site of unspecified female breast: Secondary | ICD-10-CM | POA: Diagnosis not present

## 2024-01-22 DIAGNOSIS — I10 Essential (primary) hypertension: Secondary | ICD-10-CM | POA: Diagnosis not present

## 2024-01-22 DIAGNOSIS — E1169 Type 2 diabetes mellitus with other specified complication: Secondary | ICD-10-CM | POA: Diagnosis not present

## 2024-02-20 DIAGNOSIS — L930 Discoid lupus erythematosus: Secondary | ICD-10-CM | POA: Diagnosis not present

## 2024-02-20 DIAGNOSIS — I872 Venous insufficiency (chronic) (peripheral): Secondary | ICD-10-CM | POA: Diagnosis not present

## 2024-02-20 DIAGNOSIS — Z79899 Other long term (current) drug therapy: Secondary | ICD-10-CM | POA: Diagnosis not present

## 2024-02-22 DIAGNOSIS — E785 Hyperlipidemia, unspecified: Secondary | ICD-10-CM | POA: Diagnosis not present

## 2024-02-22 DIAGNOSIS — E1169 Type 2 diabetes mellitus with other specified complication: Secondary | ICD-10-CM | POA: Diagnosis not present

## 2024-02-22 DIAGNOSIS — I10 Essential (primary) hypertension: Secondary | ICD-10-CM | POA: Diagnosis not present

## 2024-02-22 DIAGNOSIS — C50919 Malignant neoplasm of unspecified site of unspecified female breast: Secondary | ICD-10-CM | POA: Diagnosis not present

## 2024-04-23 DIAGNOSIS — E785 Hyperlipidemia, unspecified: Secondary | ICD-10-CM | POA: Diagnosis not present

## 2024-04-23 DIAGNOSIS — I10 Essential (primary) hypertension: Secondary | ICD-10-CM | POA: Diagnosis not present

## 2024-04-23 DIAGNOSIS — C50919 Malignant neoplasm of unspecified site of unspecified female breast: Secondary | ICD-10-CM | POA: Diagnosis not present

## 2024-04-23 DIAGNOSIS — E1169 Type 2 diabetes mellitus with other specified complication: Secondary | ICD-10-CM | POA: Diagnosis not present

## 2024-05-24 DIAGNOSIS — C50919 Malignant neoplasm of unspecified site of unspecified female breast: Secondary | ICD-10-CM | POA: Diagnosis not present

## 2024-05-24 DIAGNOSIS — I10 Essential (primary) hypertension: Secondary | ICD-10-CM | POA: Diagnosis not present

## 2024-05-24 DIAGNOSIS — E785 Hyperlipidemia, unspecified: Secondary | ICD-10-CM | POA: Diagnosis not present

## 2024-05-24 DIAGNOSIS — E1169 Type 2 diabetes mellitus with other specified complication: Secondary | ICD-10-CM | POA: Diagnosis not present

## 2024-06-23 DIAGNOSIS — E785 Hyperlipidemia, unspecified: Secondary | ICD-10-CM | POA: Diagnosis not present

## 2024-06-23 DIAGNOSIS — C50919 Malignant neoplasm of unspecified site of unspecified female breast: Secondary | ICD-10-CM | POA: Diagnosis not present

## 2024-06-23 DIAGNOSIS — E1169 Type 2 diabetes mellitus with other specified complication: Secondary | ICD-10-CM | POA: Diagnosis not present

## 2024-06-23 DIAGNOSIS — I10 Essential (primary) hypertension: Secondary | ICD-10-CM | POA: Diagnosis not present

## 2024-07-24 DIAGNOSIS — E1169 Type 2 diabetes mellitus with other specified complication: Secondary | ICD-10-CM | POA: Diagnosis not present

## 2024-07-24 DIAGNOSIS — C50919 Malignant neoplasm of unspecified site of unspecified female breast: Secondary | ICD-10-CM | POA: Diagnosis not present

## 2024-07-24 DIAGNOSIS — E785 Hyperlipidemia, unspecified: Secondary | ICD-10-CM | POA: Diagnosis not present

## 2024-07-24 DIAGNOSIS — I10 Essential (primary) hypertension: Secondary | ICD-10-CM | POA: Diagnosis not present

## 2024-08-23 DIAGNOSIS — E1169 Type 2 diabetes mellitus with other specified complication: Secondary | ICD-10-CM | POA: Diagnosis not present

## 2024-08-23 DIAGNOSIS — E785 Hyperlipidemia, unspecified: Secondary | ICD-10-CM | POA: Diagnosis not present

## 2024-08-23 DIAGNOSIS — I10 Essential (primary) hypertension: Secondary | ICD-10-CM | POA: Diagnosis not present

## 2024-08-23 DIAGNOSIS — C50919 Malignant neoplasm of unspecified site of unspecified female breast: Secondary | ICD-10-CM | POA: Diagnosis not present

## 2024-09-01 DIAGNOSIS — L930 Discoid lupus erythematosus: Secondary | ICD-10-CM | POA: Diagnosis not present

## 2024-09-01 DIAGNOSIS — I872 Venous insufficiency (chronic) (peripheral): Secondary | ICD-10-CM | POA: Diagnosis not present
